# Patient Record
Sex: Female | Born: 1960 | Race: White | Hispanic: No | Smoking: Current every day smoker
Health system: Southern US, Community
[De-identification: ages and names within clinical notes are randomized; demographics above are authoritative.]

## PROBLEM LIST (undated history)

## (undated) DIAGNOSIS — F32A Depression, unspecified: Secondary | ICD-10-CM

## (undated) DIAGNOSIS — I10 Essential (primary) hypertension: Secondary | ICD-10-CM

## (undated) DIAGNOSIS — M1990 Unspecified osteoarthritis, unspecified site: Secondary | ICD-10-CM

## (undated) DIAGNOSIS — F329 Major depressive disorder, single episode, unspecified: Secondary | ICD-10-CM

## (undated) DIAGNOSIS — M25562 Pain in left knee: Secondary | ICD-10-CM

## (undated) DIAGNOSIS — Z0000 Encounter for general adult medical examination without abnormal findings: Secondary | ICD-10-CM

## (undated) HISTORY — PX: KNEE SURGERY: SHX244

## (undated) HISTORY — DX: Encounter for general adult medical examination without abnormal findings: Z00.00

## (undated) HISTORY — DX: Pain in left knee: M25.562

---

## 2011-04-03 ENCOUNTER — Ambulatory Visit (INDEPENDENT_AMBULATORY_CARE_PROVIDER_SITE_OTHER): Payer: 59 | Admitting: Internal Medicine

## 2011-04-03 ENCOUNTER — Encounter: Payer: Self-pay | Admitting: Internal Medicine

## 2011-04-03 ENCOUNTER — Other Ambulatory Visit: Payer: Self-pay | Admitting: Internal Medicine

## 2011-04-03 DIAGNOSIS — F319 Bipolar disorder, unspecified: Secondary | ICD-10-CM

## 2011-04-03 DIAGNOSIS — M179 Osteoarthritis of knee, unspecified: Secondary | ICD-10-CM

## 2011-04-03 DIAGNOSIS — Z1239 Encounter for other screening for malignant neoplasm of breast: Secondary | ICD-10-CM

## 2011-04-03 DIAGNOSIS — Z1231 Encounter for screening mammogram for malignant neoplasm of breast: Secondary | ICD-10-CM

## 2011-04-03 DIAGNOSIS — F17200 Nicotine dependence, unspecified, uncomplicated: Secondary | ICD-10-CM

## 2011-04-03 DIAGNOSIS — R7309 Other abnormal glucose: Secondary | ICD-10-CM

## 2011-04-03 DIAGNOSIS — I10 Essential (primary) hypertension: Secondary | ICD-10-CM

## 2011-04-03 DIAGNOSIS — R7302 Impaired glucose tolerance (oral): Secondary | ICD-10-CM

## 2011-04-03 DIAGNOSIS — M1710 Unilateral primary osteoarthritis, unspecified knee: Secondary | ICD-10-CM | POA: Insufficient documentation

## 2011-04-03 DIAGNOSIS — E785 Hyperlipidemia, unspecified: Secondary | ICD-10-CM

## 2011-04-03 NOTE — Assessment & Plan Note (Signed)
Asx. Pt questions diagnosis. Asked to followup closely if sx's of depression and/or mania.

## 2011-04-03 NOTE — Assessment & Plan Note (Signed)
Schedule screening mammogram

## 2011-04-03 NOTE — Assessment & Plan Note (Signed)
Stable and mild. Low sugar/carb diet, exercise as tolerated and wt loss.

## 2011-04-03 NOTE — Assessment & Plan Note (Signed)
Counseled regarding the need for cessation. Pt states understanding but is in contemplative phase.

## 2011-04-03 NOTE — Assessment & Plan Note (Signed)
Stable. Continue pravachol. Obtain flp/lft with next visit.

## 2011-04-03 NOTE — Assessment & Plan Note (Signed)
Normotensive. Continue current regimen. Monitor bp as outpt and followup in clinic as scheduled.

## 2011-04-03 NOTE — Progress Notes (Signed)
  Subjective:    Patient ID: Yvonne Rice, female    DOB: April 14, 1961, 50 y.o.   MRN: [id number]  HPI patient presented to clinic to establish primary care and for followup of hyperglycemia. His history of glucose intolerance with blood sugars fasting slightly above one hundred. Has no formal diagnosis of diabetes mellitus and denies polydipsia or polyuria. Apparently has attempted Glucophage and Actos in the past was intolerant to both medications. Previously diagnosed with bipolar disorder maintained on Trileptal as well as Lamictal previously. Patient states has been some disagreement about the diagnosis between psychiatrists and currently feels stable without symptoms of depression or mania. Is off medications and is without psychiatry. Smokes tobacco chronically less than one pack per day and is in the contemplative state. History of hypertension which is currently well controlled with current regimen without adverse effect. Tolerate statin therapy without myalgias or abnormal LFTs. Suffers from chronic left knee osteoarthritis status post steroid injections, Synvisc injections and was recommended for surgery at a later point in her life. Walks with assistance of a cane and  pain is manageable with Clinoril and Tylenol when necessary. In the past has had handicap placard. Is status post hysterectomy for noncancerous indication. Needs updated screening mammogram with last mammogram being a diagnostic mammogram 12/11 and unremarkable and last screening mammogram 4/11. Reviewed last lab work dated March 2012 including Chem-7 glucose of one hundred and two, normal liver function test and fasting lipid profile with LDL of seventy-four. Tetanus up-to-date 2005. No other complaints.  Reviewed past medical history: Past history, medications, allergies, social history and family history    Review of Systems  Constitutional: Negative for fever and chills.  Respiratory: Negative for cough and shortness of  breath.   Gastrointestinal: Negative for abdominal pain and blood in stool.  Musculoskeletal: Positive for arthralgias. Negative for myalgias and joint swelling.  Skin: Negative for color change and rash.  Psychiatric/Behavioral: Negative for dysphoric mood and agitation. The patient is not nervous/anxious.   All other systems reviewed and are negative.       Objective:   Physical Exam  Nursing note and vitals reviewed. Constitutional: She appears well-developed and well-nourished. No distress.  HENT:  Head: Normocephalic and atraumatic.  Right Ear: External ear normal.  Left Ear: External ear normal.  Nose: Nose normal.  Mouth/Throat: Oropharynx is clear and moist. No oropharyngeal exudate.  Eyes: Conjunctivae are normal. No scleral icterus.  Neck: Neck supple. No JVD present.  Cardiovascular: Normal rate, regular rhythm and normal heart sounds.  Exam reveals no gallop and no friction rub.   No murmur heard. Pulmonary/Chest: Effort normal and breath sounds normal. No respiratory distress. She has no wheezes. She has no rales.  Musculoskeletal:       Left knee: no erythem, warmth or effusion. +crepitus.  Lymphadenopathy:    She has no cervical adenopathy.  Neurological: She is alert.  Skin: Skin is warm and dry. She is not diaphoretic.  Psychiatric: She has a normal mood and affect. Her behavior is normal.          Assessment & Plan:

## 2011-04-03 NOTE — Assessment & Plan Note (Signed)
Stable. Continue current regimen. Handicap placard form provided.

## 2011-04-15 ENCOUNTER — Ambulatory Visit
Admission: RE | Admit: 2011-04-15 | Discharge: 2011-04-15 | Disposition: A | Discharge status: Home / Self Care | Payer: 59 | Source: Ambulatory Visit | Attending: Internal Medicine | Admitting: Internal Medicine

## 2011-04-15 DIAGNOSIS — Z1231 Encounter for screening mammogram for malignant neoplasm of breast: Secondary | ICD-10-CM

## 2011-07-04 ENCOUNTER — Ambulatory Visit: Payer: 59 | Admitting: Internal Medicine

## 2011-07-04 ENCOUNTER — Encounter: Payer: Self-pay | Admitting: Internal Medicine

## 2011-07-04 ENCOUNTER — Ambulatory Visit (INDEPENDENT_AMBULATORY_CARE_PROVIDER_SITE_OTHER): Payer: 59 | Admitting: Internal Medicine

## 2011-07-04 DIAGNOSIS — I10 Essential (primary) hypertension: Secondary | ICD-10-CM

## 2011-07-04 DIAGNOSIS — Z79899 Other long term (current) drug therapy: Secondary | ICD-10-CM

## 2011-07-04 DIAGNOSIS — E785 Hyperlipidemia, unspecified: Secondary | ICD-10-CM

## 2011-07-04 LAB — CBC WITH DIFFERENTIAL/PLATELET
Basophils Absolute: 0 10*3/uL (ref 0.0–0.1)
Basophils Relative: 0 % (ref 0–1)
Eosinophils Absolute: 0.1 10*3/uL (ref 0.0–0.7)
Eosinophils Relative: 1 % (ref 0–5)
HCT: 45.4 % (ref 36.0–46.0)
Hemoglobin: 15.4 g/dL — ABNORMAL HIGH (ref 12.0–15.0)
Lymphocytes Relative: 24 % (ref 12–46)
Lymphs Abs: 1.8 10*3/uL (ref 0.7–4.0)
MCH: 30.9 pg (ref 26.0–34.0)
MCHC: 33.9 g/dL (ref 30.0–36.0)
MCV: 91.2 fL (ref 78.0–100.0)
Monocytes Absolute: 0.5 10*3/uL (ref 0.1–1.0)
Monocytes Relative: 7 % (ref 3–12)
Neutro Abs: 5.1 10*3/uL (ref 1.7–7.7)
Neutrophils Relative %: 68 % (ref 43–77)
Platelets: 270 10*3/uL (ref 150–400)
RBC: 4.98 MIL/uL (ref 3.87–5.11)
RDW: 13.3 % (ref 11.5–15.5)
WBC: 7.4 10*3/uL (ref 4.0–10.5)

## 2011-07-04 LAB — LIPID PANEL
Cholesterol: 170 mg/dL (ref 0–200)
HDL: 51 mg/dL
LDL Cholesterol: 91 mg/dL (ref 0–99)
Total CHOL/HDL Ratio: 3.3 ratio
Triglycerides: 140 mg/dL
VLDL: 28 mg/dL (ref 0–40)

## 2011-07-04 LAB — BASIC METABOLIC PANEL
BUN: 8 mg/dL (ref 6–23)
CO2: 27 mEq/L (ref 19–32)
Calcium: 10.8 mg/dL — ABNORMAL HIGH (ref 8.4–10.5)
Glucose, Bld: 107 mg/dL — ABNORMAL HIGH (ref 70–99)
Potassium: 4.4 mEq/L (ref 3.5–5.3)
Sodium: 140 mEq/L (ref 135–145)

## 2011-07-04 LAB — HEPATIC FUNCTION PANEL
ALT: 17 U/L (ref 0–35)
Bilirubin, Direct: 0.1 mg/dL (ref 0.0–0.3)
Indirect Bilirubin: 0.3 mg/dL (ref 0.0–0.9)
Total Bilirubin: 0.4 mg/dL (ref 0.3–1.2)

## 2011-07-04 MED ORDER — PRAVASTATIN SODIUM 40 MG PO TABS: 40.0000 mg | ORAL_TABLET | Freq: Every day | ORAL | Status: DC

## 2011-07-04 MED ORDER — DILTIAZEM HCL ER COATED BEADS 180 MG PO CP24: 360.0000 mg | ORAL_CAPSULE | Freq: Every day | ORAL | Status: DC

## 2011-07-04 MED ORDER — LISINOPRIL-HYDROCHLOROTHIAZIDE 10-12.5 MG PO TABS: 1.0000 | ORAL_TABLET | Freq: Every day | ORAL | Status: DC

## 2011-07-07 NOTE — Progress Notes (Signed)
  Subjective:    Patient ID: Yvonne Rice, female    DOB: [birth year], 50 y.o.   MRN: [id number]  HPI Pt presents to clinic for followup of multiple medical problems.  H/o HTN and bp reviewed nl. Tolerates statin tx without myalgias or abn lft. H/o hyperglycemia with fsbs fasting usually 90's without medication. Using tobacco but enrolled in cessation program at work.   No past medical history on file. No past surgical history on file.  reports that she has been smoking Cigarettes.  She has been smoking about .8 packs per day. She has never used smokeless tobacco. She reports that she does not drink alcohol or use illicit drugs. family history includes Cancer in her mother and paternal grandmother and Heart disease in her father. Allergies  Allergen Reactions  . Sulfa Drugs Cross Reactors Hives     Review of Systems see hpi     Objective:   Physical Exam  Physical Exam  Nursing note and vitals reviewed. Constitutional: Appears well-developed and well-nourished. No distress.  HENT:  Head: Normocephalic and atraumatic.  Right Ear: External ear normal.  Left Ear: External ear normal.  Eyes: Conjunctivae are normal. No scleral icterus.  Neck: Neck supple. Carotid bruit is not present.  Cardiovascular: Normal rate, regular rhythm and normal heart sounds.  Exam reveals no gallop and no friction rub.   No murmur heard. Pulmonary/Chest: Effort normal and breath sounds normal. No respiratory distress. He has no wheezes. no rales.  Lymphadenopathy:    He has no cervical adenopathy.  Neurological:Alert.  Skin: Skin is warm and dry. Not diaphoretic.  Psychiatric: Has a normal mood and affect.        Assessment & Plan:

## 2011-07-07 NOTE — Assessment & Plan Note (Signed)
Obtain lipid/lft. 

## 2011-07-11 ENCOUNTER — Other Ambulatory Visit: Payer: Self-pay | Admitting: *Deleted

## 2011-07-11 DIAGNOSIS — R799 Abnormal finding of blood chemistry, unspecified: Secondary | ICD-10-CM

## 2011-07-27 LAB — CALCIUM, IONIZED: Calcium, Ion: 1.36 mmol/L — ABNORMAL HIGH (ref 1.12–1.32)

## 2011-08-08 ENCOUNTER — Other Ambulatory Visit: Payer: Self-pay | Admitting: Internal Medicine

## 2011-08-08 MED ORDER — LISINOPRIL 10 MG PO TABS: 10.0000 mg | ORAL_TABLET | Freq: Every day | ORAL | Status: DC

## 2011-09-21 LAB — PHOSPHORUS: Phosphorus: 4 mg/dL (ref 2.3–4.6)

## 2011-10-24 ENCOUNTER — Other Ambulatory Visit: Payer: Self-pay | Admitting: *Deleted

## 2011-10-24 MED ORDER — TRAMADOL HCL 50 MG PO TABS: 50.0000 mg | ORAL_TABLET | Freq: Four times a day (QID) | ORAL | Status: DC | PRN

## 2011-10-24 NOTE — Telephone Encounter (Signed)
Rx refill sent to pharmacy. 

## 2011-10-24 NOTE — Telephone Encounter (Signed)
Don't believe we've filled it before. #[phone] [phone]

## 2011-10-24 NOTE — Telephone Encounter (Signed)
Received message from pt requesting refill of Tramadol 50mg  1 tablet four times daily as needed. States current refills have expired. Please advise number of refills to give pt.

## 2012-01-03 ENCOUNTER — Ambulatory Visit (INDEPENDENT_AMBULATORY_CARE_PROVIDER_SITE_OTHER): Payer: 59 | Admitting: Internal Medicine

## 2012-01-03 ENCOUNTER — Encounter: Payer: Self-pay | Admitting: Internal Medicine

## 2012-01-03 DIAGNOSIS — F329 Major depressive disorder, single episode, unspecified: Secondary | ICD-10-CM

## 2012-01-03 DIAGNOSIS — I10 Essential (primary) hypertension: Secondary | ICD-10-CM

## 2012-01-03 DIAGNOSIS — E785 Hyperlipidemia, unspecified: Secondary | ICD-10-CM

## 2012-01-03 DIAGNOSIS — Z79899 Other long term (current) drug therapy: Secondary | ICD-10-CM

## 2012-01-03 LAB — HEPATIC FUNCTION PANEL
ALT: 22 U/L (ref 0–35)
Bilirubin, Direct: 0.1 mg/dL (ref 0.0–0.3)
Total Protein: 6.8 g/dL (ref 6.0–8.3)

## 2012-01-03 LAB — CBC WITH DIFFERENTIAL/PLATELET
Basophils Absolute: 0 10*3/uL (ref 0.0–0.1)
Eosinophils Relative: 1 % (ref 0–5)
HCT: 42.7 % (ref 36.0–46.0)
Hemoglobin: 14.2 g/dL (ref 12.0–15.0)
Lymphocytes Relative: 24 % (ref 12–46)
MCH: 30.5 pg (ref 26.0–34.0)
MCHC: 33.3 g/dL (ref 30.0–36.0)
Neutrophils Relative %: 66 % (ref 43–77)
RBC: 4.66 MIL/uL (ref 3.87–5.11)

## 2012-01-03 LAB — LIPID PANEL
Cholesterol: 167 mg/dL (ref 0–200)
Total CHOL/HDL Ratio: 3.2 Ratio
Triglycerides: 95 mg/dL (ref <150)
VLDL: 19 mg/dL (ref 0–40)

## 2012-01-03 LAB — BASIC METABOLIC PANEL
Chloride: 106 mEq/L (ref 96–112)
Creat: 0.76 mg/dL (ref 0.50–1.10)

## 2012-01-03 MED ORDER — DILTIAZEM HCL ER COATED BEADS 180 MG PO CP24: 360.0000 mg | ORAL_CAPSULE | Freq: Every day | ORAL | Status: DC

## 2012-01-03 MED ORDER — CITALOPRAM HYDROBROMIDE 20 MG PO TABS: 20.0000 mg | ORAL_TABLET | Freq: Every day | ORAL | Status: DC

## 2012-01-03 MED ORDER — SULINDAC 200 MG PO TABS: 200.0000 mg | ORAL_TABLET | Freq: Two times a day (BID) | ORAL | Status: DC

## 2012-01-03 MED ORDER — TRAMADOL HCL 50 MG PO TABS: 50.0000 mg | ORAL_TABLET | Freq: Four times a day (QID) | ORAL | Status: DC | PRN

## 2012-01-03 MED ORDER — PRAVASTATIN SODIUM 40 MG PO TABS: 40.0000 mg | ORAL_TABLET | Freq: Every day | ORAL | Status: DC

## 2012-01-03 MED ORDER — LISINOPRIL 10 MG PO TABS: 10.0000 mg | ORAL_TABLET | Freq: Every day | ORAL | Status: DC

## 2012-01-03 NOTE — Assessment & Plan Note (Signed)
Begin celexa 20mg  qd. Cautioned regarding use of celexa and ultram. States uses ultram rarely. F/u one month or sooner if needed.

## 2012-01-03 NOTE — Assessment & Plan Note (Signed)
Obtain lipid/lft. 

## 2012-01-03 NOTE — Progress Notes (Signed)
  Subjective:    Patient ID: Yvonne Rice, female    DOB: 1961/04/23, 51 y.o.   MRN: [id number]  HPI Pt presents to clinic for followup of multiple medical problems. Notes depressive sx's recently with h/o depression. Not currently tx'ed but feels the need for medication. Not currently interested in therapist. Has crying spells and work stressors but denies SI/HI. Chronic knee pain followed by orthopedics with recommendation for surgery. Restricts her exercise. BP reviewed normotensive. No other complaints.  No past medical history on file. No past surgical history on file.  reports that she has been smoking Cigarettes.  She has been smoking about .8 packs per day. She has never used smokeless tobacco. She reports that she does not drink alcohol or use illicit drugs. family history includes Cancer in her mother and paternal grandmother and Heart disease in her father. Allergies  Allergen Reactions  . Sulfa Drugs Cross Reactors Hives      Review of Systems see hpi     Objective:   Physical Exam  Physical Exam  Nursing note and vitals reviewed. Constitutional: Appears well-developed and well-nourished. No distress.  HENT:  Head: Normocephalic and atraumatic.  Right Ear: External ear normal.  Left Ear: External ear normal.  Eyes: Conjunctivae are normal. No scleral icterus.  Neck: Neck supple. Carotid bruit is not present.  Cardiovascular: Normal rate, regular rhythm and normal heart sounds.  Exam reveals no gallop and no friction rub.   No murmur heard. Pulmonary/Chest: Effort normal and breath sounds normal. No respiratory distress. He has no wheezes. no rales.  Lymphadenopathy:    He has no cervical adenopathy.  Neurological:Alert.  Skin: Skin is warm and dry. Not diaphoretic.  Psychiatric: Has a normal mood and affect.        Assessment & Plan:

## 2012-01-03 NOTE — Assessment & Plan Note (Signed)
Normotensive and stable. Continue current regimen. Monitor bp as outpt and followup in clinic as scheduled. Obtain cbc, chem7 

## 2012-01-07 ENCOUNTER — Other Ambulatory Visit: Payer: Self-pay | Admitting: Internal Medicine

## 2012-01-22 ENCOUNTER — Other Ambulatory Visit: Payer: Self-pay | Admitting: *Deleted

## 2012-01-23 LAB — CALCIUM, IONIZED: Calcium, Ion: 1.31 mmol/L (ref 1.12–1.32)

## 2012-01-23 LAB — VITAMIN D 25 HYDROXY (VIT D DEFICIENCY, FRACTURES): Vit D, 25-Hydroxy: 43 ng/mL (ref 30–89)

## 2012-01-28 ENCOUNTER — Other Ambulatory Visit: Payer: Self-pay | Admitting: Internal Medicine

## 2012-01-31 ENCOUNTER — Encounter: Payer: Self-pay | Admitting: Internal Medicine

## 2012-01-31 ENCOUNTER — Ambulatory Visit (INDEPENDENT_AMBULATORY_CARE_PROVIDER_SITE_OTHER): Payer: 59 | Admitting: Internal Medicine

## 2012-01-31 VITALS — BP 108/68 | HR 103 | Temp 97.8°F | Resp 20 | Wt 190.0 lb

## 2012-01-31 DIAGNOSIS — F3289 Other specified depressive episodes: Secondary | ICD-10-CM

## 2012-01-31 DIAGNOSIS — F329 Major depressive disorder, single episode, unspecified: Secondary | ICD-10-CM

## 2012-01-31 MED ORDER — CITALOPRAM HYDROBROMIDE 20 MG PO TABS: 20.0000 mg | ORAL_TABLET | Freq: Every day | ORAL | Status: DC

## 2012-01-31 NOTE — Progress Notes (Signed)
This encounter was created in error - please disregard.

## 2012-01-31 NOTE — Patient Instructions (Signed)
Please schedule ionized calcium, phosphorus, PTH- hypercalcemia, tsh-401.9, lipid/lft-272.4 and chem7-v58.69 prior to next visit

## 2012-02-02 NOTE — Progress Notes (Signed)
  Subjective:    Patient ID: Yvonne Rice, female    DOB: 03-Dec-1960, 51 y.o.   MRN: [id number]  HPI Pt presents to clinic for followup of multiple medical problems. Tolerating celexa without adverse effect. Notes improvement of mood. Reviewed elevated calcium followed by nl ionized calcium. No complaints.  No past medical history on file. No past surgical history on file.  reports that she has been smoking Cigarettes.  She has been smoking about .8 packs per day. She has never used smokeless tobacco. She reports that she does not drink alcohol or use illicit drugs. family history includes Cancer in her mother and paternal grandmother and Heart disease in her father. Allergies  Allergen Reactions  . Sulfa Drugs Cross Reactors Hives      Review of Systems see hpi     Objective:   Physical Exam  Nursing note and vitals reviewed. Constitutional: She appears well-developed and well-nourished. No distress.  HENT:  Head: Normocephalic and atraumatic.  Right Ear: External ear normal.  Left Ear: External ear normal.  Eyes: Conjunctivae are normal. No scleral icterus.  Cardiovascular: Normal rate, regular rhythm and normal heart sounds.   Pulmonary/Chest: Effort normal and breath sounds normal. No respiratory distress. She has no wheezes. She has no rales.  Neurological: She is alert.  Skin: She is not diaphoretic.  Psychiatric: She has a normal mood and affect. Her behavior is normal.          Assessment & Plan:

## 2012-02-02 NOTE — Assessment & Plan Note (Signed)
Normalized ionized calcium. Repeat calcium labs with next visit

## 2012-02-02 NOTE — Assessment & Plan Note (Signed)
Improved with celexa. Continue current dose.

## 2012-02-21 ENCOUNTER — Telehealth: Payer: Self-pay | Admitting: *Deleted

## 2012-02-21 NOTE — Telephone Encounter (Signed)
Fax received from [facility] regarding clarification of patients medication. The fax stated patient is trying to refill Lisinopril & Lisinopril HCT 10/12.5. Should the patient be on both medications?  (order # [id number])

## 2012-02-21 NOTE — Telephone Encounter (Signed)
No. Lisinopril hct changed to lisinopril 07/2011

## 2012-02-21 NOTE — Telephone Encounter (Signed)
Call placed to [facility] [phone], spoke with Onalee Hua a pharmacist; he was provide clarification on medication. No further action required.

## 2012-03-24 ENCOUNTER — Other Ambulatory Visit: Payer: Self-pay | Admitting: Orthopedic Surgery

## 2012-03-27 ENCOUNTER — Encounter (HOSPITAL_BASED_OUTPATIENT_CLINIC_OR_DEPARTMENT_OTHER): Payer: Self-pay | Admitting: Family Medicine

## 2012-03-27 ENCOUNTER — Emergency Department (HOSPITAL_BASED_OUTPATIENT_CLINIC_OR_DEPARTMENT_OTHER)
Admission: EM | Admit: 2012-03-27 | Discharge: 2012-03-27 | Disposition: A | Discharge status: Home / Self Care | Payer: 59 | Attending: Emergency Medicine | Admitting: Emergency Medicine

## 2012-03-27 ENCOUNTER — Emergency Department (HOSPITAL_BASED_OUTPATIENT_CLINIC_OR_DEPARTMENT_OTHER): Payer: 59

## 2012-03-27 DIAGNOSIS — F3289 Other specified depressive episodes: Secondary | ICD-10-CM | POA: Insufficient documentation

## 2012-03-27 DIAGNOSIS — I10 Essential (primary) hypertension: Secondary | ICD-10-CM | POA: Insufficient documentation

## 2012-03-27 DIAGNOSIS — F329 Major depressive disorder, single episode, unspecified: Secondary | ICD-10-CM | POA: Insufficient documentation

## 2012-03-27 DIAGNOSIS — Z8739 Personal history of other diseases of the musculoskeletal system and connective tissue: Secondary | ICD-10-CM | POA: Insufficient documentation

## 2012-03-27 DIAGNOSIS — Z79899 Other long term (current) drug therapy: Secondary | ICD-10-CM | POA: Insufficient documentation

## 2012-03-27 DIAGNOSIS — M25569 Pain in unspecified knee: Secondary | ICD-10-CM

## 2012-03-27 HISTORY — DX: Major depressive disorder, single episode, unspecified: F32.9

## 2012-03-27 HISTORY — DX: Unspecified osteoarthritis, unspecified site: M19.90

## 2012-03-27 HISTORY — DX: Depression, unspecified: F32.A

## 2012-03-27 HISTORY — DX: Essential (primary) hypertension: I10

## 2012-03-27 MED ORDER — HYDROCODONE-ACETAMINOPHEN 5-325 MG PO TABS: 1.0000 | ORAL_TABLET | Freq: Four times a day (QID) | ORAL | Status: AC | PRN

## 2012-03-27 NOTE — ED Notes (Signed)
Patient transported to X-ray 

## 2012-03-27 NOTE — Discharge Instructions (Signed)
Knee Pain The knee is the complex joint between your thigh and your lower leg. It is made up of bones, tendons, ligaments, and cartilage. The bones that make up the knee are:  The femur in the thigh.   The tibia and fibula in the lower leg.   The patella or kneecap riding in the groove on the lower femur.  CAUSES  Knee pain is a common complaint with many causes. A few of these causes are:  Injury, such as:   A ruptured ligament or tendon injury.   Torn cartilage.   Medical conditions, such as:   Gout   Arthritis   Infections   Overuse, over training or overdoing a physical activity.  Knee pain can be minor or severe. Knee pain can accompany debilitating injury. Minor knee problems often respond well to self-care measures or get well on their own. More serious injuries may need medical intervention or even surgery. SYMPTOMS The knee is complex. Symptoms of knee problems can vary widely. Some of the problems are:  Pain with movement and weight bearing.   Swelling and tenderness.   Buckling of the knee.   Inability to straighten or extend your knee.   Your knee locks and you cannot straighten it.   Warmth and redness with pain and fever.   Deformity or dislocation of the kneecap.  DIAGNOSIS  Determining what is wrong may be very straight forward such as when there is an injury. It can also be challenging because of the complexity of the knee. Tests to make a diagnosis may include:  Your caregiver taking a history and doing a physical exam.   Routine X-rays can be used to rule out other problems. X-rays will not reveal a cartilage tear. Some injuries of the knee can be diagnosed by:   Arthroscopy a surgical technique by which a small video camera is inserted through tiny incisions on the sides of the knee. This procedure is used to examine and repair internal knee joint problems. Tiny instruments can be used during arthroscopy to repair the torn knee cartilage  (meniscus).   Arthrography is a radiology technique. A contrast liquid is directly injected into the knee joint. Internal structures of the knee joint then become visible on X-ray film.   An MRI scan is a non x-ray radiology procedure in which magnetic fields and a computer produce two- or three-dimensional images of the inside of the knee. Cartilage tears are often visible using an MRI scanner. MRI scans have largely replaced arthrography in diagnosing cartilage tears of the knee.   Blood work.   Examination of the fluid that helps to lubricate the knee joint (synovial fluid). This is done by taking a sample out using a needle and a syringe.  TREATMENT The treatment of knee problems depends on the cause. Some of these treatments are:  Depending on the injury, proper casting, splinting, surgery or physical therapy care will be needed.   Give yourself adequate recovery time. Do not overuse your joints. If you begin to get sore during workout routines, back off. Slow down or do fewer repetitions.   For repetitive activities such as cycling or running, maintain your strength and nutrition.   Alternate muscle groups. For example if you are a weight lifter, work the upper body on one day and the lower body the next.   Either tight or weak muscles do not give the proper support for your knee. Tight or weak muscles do not absorb the stress placed   on the knee joint. Keep the muscles surrounding the knee strong.   Take care of mechanical problems.   If you have flat feet, orthotics or special shoes may help. See your caregiver if you need help.   Arch supports, sometimes with wedges on the inner or outer aspect of the heel, can help. These can shift pressure away from the side of the knee most bothered by osteoarthritis.   A brace called an "unloader" brace also may be used to help ease the pressure on the most arthritic side of the knee.   If your caregiver has prescribed crutches, braces,  wraps or ice, use as directed. The acronym for this is PRICE. This means protection, rest, ice, compression and elevation.   Nonsteroidal anti-inflammatory drugs (NSAID's), can help relieve pain. But if taken immediately after an injury, they may actually increase swelling. Take NSAID's with food in your stomach. Stop them if you develop stomach problems. Do not take these if you have a history of ulcers, stomach pain or bleeding from the bowel. Do not take without your caregiver's approval if you have problems with fluid retention, heart failure, or kidney problems.   For ongoing knee problems, physical therapy may be helpful.   Glucosamine and chondroitin are over-the-counter dietary supplements. Both may help relieve the pain of osteoarthritis in the knee. These medicines are different from the usual anti-inflammatory drugs. Glucosamine may decrease the rate of cartilage destruction.   Injections of a corticosteroid drug into your knee joint may help reduce the symptoms of an arthritis flare-up. They may provide pain relief that lasts a few months. You may have to wait a few months between injections. The injections do have a small increased risk of infection, water retention and elevated blood sugar levels.   Hyaluronic acid injected into damaged joints may ease pain and provide lubrication. These injections may work by reducing inflammation. A series of shots may give relief for as long as 6 months.   Topical painkillers. Applying certain ointments to your skin may help relieve the pain and stiffness of osteoarthritis. Ask your pharmacist for suggestions. Many over the-counter products are approved for temporary relief of arthritis pain.   In some countries, doctors often prescribe topical NSAID's for relief of chronic conditions such as arthritis and tendinitis. A review of treatment with NSAID creams found that they worked as well as oral medications but without the serious side effects.    PREVENTION  Maintain a healthy weight. Extra pounds put more strain on your joints.   Get strong, stay limber. Weak muscles are a common cause of knee injuries. Stretching is important. Include flexibility exercises in your workouts.   Be smart about exercise. If you have osteoarthritis, chronic knee pain or recurring injuries, you may need to change the way you exercise. This does not mean you have to stop being active. If your knees ache after jogging or playing basketball, consider switching to swimming, water aerobics or other low-impact activities, at least for a few days a week. Sometimes limiting high-impact activities will provide relief.   Make sure your shoes fit well. Choose footwear that is right for your sport.   Protect your knees. Use the proper gear for knee-sensitive activities. Use kneepads when playing volleyball or laying carpet. Buckle your seat belt every time you drive. Most shattered kneecaps occur in car accidents.   Rest when you are tired.  SEEK MEDICAL CARE IF:  You have knee pain that is continual and does not   seem to be getting better.  SEEK IMMEDIATE MEDICAL CARE IF:  Your knee joint feels hot to the touch and you have a high fever. MAKE SURE YOU:   Understand these instructions.   Will watch your condition.   Will get help right away if you are not doing well or get worse.  Document Released: 08/25/2007 Document Revised: 10/17/2011 Document Reviewed: 08/25/2007 [facility] Patient Information 2012 [place], [place].  Watch for the development of redness, swelling and is not resolved with elevation, calf pain as this may indicate a blood clot

## 2012-03-27 NOTE — ED Notes (Signed)
Pt c/o left leg pain and tingling from left knee to foot x 2 months. Calf is non tender. Cms intact. Pt ambulatory.

## 2012-03-27 NOTE — ED Notes (Signed)
MD at bedside. 

## 2012-03-27 NOTE — ED Provider Notes (Signed)
History     CSN: [id number]  Arrival date & time 03/27/12  0865   First MD Initiated Contact with Patient 03/27/12 [phone]      Chief Complaint  Patient presents with  . Leg Pain    (Consider location/radiation/quality/duration/timing/severity/associated sxs/prior treatment) HPI Comments: Increasing pain in the subjective feeling of instability over the past 2 months. States she nearly fell getting out of the bathtub today. Has no weakness, numbness, tingling. Her mother had DVTs so she seeks evaluation for this.  Patient is a 51 y.o. female presenting with knee pain. The history is provided by the patient. No language interpreter was used.  Knee Pain This is a chronic problem. Episode onset: 2 months ago. The problem occurs constantly. The problem has been gradually worsening. Pertinent negatives include no chest pain, no abdominal pain, no headaches and no shortness of breath. The symptoms are aggravated by standing and walking. The symptoms are relieved by nothing. Treatments tried: sulindac. The treatment provided mild relief.    Past Medical History  Diagnosis Date  . Hypertension   . Depression   . Arthritis     Past Surgical History  Procedure Date  . Knee surgery     Family History  Problem Relation Age of Onset  . Cancer Mother     colon  . Heart disease Father   . Cancer Paternal Grandmother     ovarian, uterine, breast    History  Substance Use Topics  . Smoking status: Current Everyday Smoker -- 0.8 packs/day    Types: Cigarettes  . Smokeless tobacco: Never Used  . Alcohol Use: No    OB History    Grav Para Term Preterm Abortions TAB SAB Ect Mult Living                  Review of Systems  Constitutional: Negative for fever, chills, activity change and fatigue.  HENT: Negative for congestion, sore throat, rhinorrhea, neck pain and neck stiffness.   Respiratory: Negative for cough and shortness of breath.   Cardiovascular: Negative for chest pain  and palpitations.  Gastrointestinal: Negative for nausea, vomiting and abdominal pain.  Genitourinary: Negative for dysuria, urgency, frequency and flank pain.  Musculoskeletal: Positive for arthralgias. Negative for myalgias, back pain and gait problem.  Neurological: Negative for dizziness, weakness, light-headedness, numbness and headaches.  All other systems reviewed and are negative.    Allergies  Sulfa drugs cross reactors  Home Medications   Current Outpatient Rx  Name Route Sig Dispense Refill  . CITALOPRAM HYDROBROMIDE 20 MG PO TABS Oral Take 1 tablet (20 mg total) by mouth daily. 90 tablet 3  . DILTIAZEM HCL ER COATED BEADS 180 MG PO CP24 Oral Take 2 capsules (360 mg total) by mouth daily. 180 capsule 2  . HYDROCODONE-ACETAMINOPHEN 5-325 MG PO TABS Oral Take 1-2 tablets by mouth every 6 (six) hours as needed for pain. 15 tablet 0  . LISINOPRIL 10 MG PO TABS Oral Take 1 tablet (10 mg total) by mouth daily. 90 tablet 2    Rx change from Lisinopril-Hct  . PRAVASTATIN SODIUM 40 MG PO TABS Oral Take 1 tablet (40 mg total) by mouth daily. 90 tablet 2  . SULINDAC 200 MG PO TABS Oral Take 1 tablet (200 mg total) by mouth 2 (two) times daily. 180 tablet 1  . TRAMADOL HCL 50 MG PO TABS Oral Take 1 tablet (50 mg total) by mouth every 6 (six) hours as needed. 30 tablet 3  BP 138/79  Pulse 79  Temp(Src) 97.8 F (36.6 C) (Oral)  Resp 20  SpO2 98%  Physical Exam  Nursing note and vitals reviewed. Constitutional: She is oriented to person, place, and time. She appears well-developed and well-nourished. No distress.  HENT:  Head: Normocephalic and atraumatic.  Mouth/Throat: Oropharynx is clear and moist. No oropharyngeal exudate.  Eyes: Conjunctivae and EOM are normal. Pupils are equal, round, and reactive to light.  Neck: Normal range of motion. Neck supple.  Cardiovascular: Normal rate, regular rhythm, normal heart sounds and intact distal pulses.  Exam reveals no gallop and no  friction rub.   No murmur heard. Pulmonary/Chest: Effort normal and breath sounds normal. No respiratory distress. She exhibits no tenderness.  Abdominal: Soft. Bowel sounds are normal. There is no tenderness. There is no rebound and no guarding.  Musculoskeletal: She exhibits tenderness. She exhibits no edema.       Left knee: She exhibits normal range of motion, no swelling, no effusion, no ecchymosis and no LCL laxity. tenderness found. Medial joint line and lateral joint line tenderness noted. No MCL, no LCL and no patellar tendon tenderness noted.       No pain on palpation of the L calf or thigh.  Legs appear symmetrical with no evidence of swelling to the LLE.  Negative homans sign.  No palpable cords.  No erythema.  Symmetric distal pulses in LE bilaterally  Neurological: She is alert and oriented to person, place, and time. She has normal strength. No cranial nerve deficit or sensory deficit.    ED Course  Procedures (including critical care time)  Labs Reviewed - No data to display Dg Knee Complete 4 Views Left  03/27/2012  *RADIOLOGY REPORT*  Clinical Data: 51 year old female with pain and swelling.  LEFT KNEE - COMPLETE 4+ VIEW  Comparison: None.  Findings: No definite joint effusion. Bone mineralization is within normal limits.  Patella intact.  Joint spaces preserved.  No fracture dislocation.  IMPRESSION: No acute osseous abnormality identified about the left knee.  Original Report Authenticated By: Ulla Potash III, M.D.     1. Knee pain       MDM  Knee pain with no radiographic abnormalities at this time. I have no concern about a DVT at this time as she lacks cardinal symptoms. She has no swelling. She'll be discharged home with instructions to continue taking her anti-inflammatory medication. Norco was added for pain. Instructed to followup with her orthopedist in one week. Provided red flag symptoms for DVT        Dayton Bailiff, MD 03/27/12 [phone]

## 2012-05-08 ENCOUNTER — Other Ambulatory Visit: Payer: Self-pay | Admitting: *Deleted

## 2012-05-08 ENCOUNTER — Encounter: Payer: Self-pay | Admitting: Internal Medicine

## 2012-05-08 ENCOUNTER — Ambulatory Visit (INDEPENDENT_AMBULATORY_CARE_PROVIDER_SITE_OTHER): Payer: 59 | Admitting: Internal Medicine

## 2012-05-08 VITALS — BP 100/70 | HR 70 | Temp 98.0°F | Resp 16 | Wt 200.0 lb

## 2012-05-08 DIAGNOSIS — K5289 Other specified noninfective gastroenteritis and colitis: Secondary | ICD-10-CM

## 2012-05-08 DIAGNOSIS — R197 Diarrhea, unspecified: Secondary | ICD-10-CM

## 2012-05-08 DIAGNOSIS — K529 Noninfective gastroenteritis and colitis, unspecified: Secondary | ICD-10-CM

## 2012-05-08 LAB — CBC WITH DIFFERENTIAL/PLATELET
Basophils Absolute: 0.1 10*3/uL (ref 0.0–0.1)
Eosinophils Relative: 2 % (ref 0–5)
HCT: 41.7 % (ref 36.0–46.0)
Lymphocytes Relative: 42 % (ref 12–46)
Lymphs Abs: 3.4 10*3/uL (ref 0.7–4.0)
MCV: 87.4 fL (ref 78.0–100.0)
Monocytes Absolute: 0.6 10*3/uL (ref 0.1–1.0)
Neutro Abs: 3.8 10*3/uL (ref 1.7–7.7)
Platelets: 274 10*3/uL (ref 150–400)
RBC: 4.77 MIL/uL (ref 3.87–5.11)
RDW: 12.7 % (ref 11.5–15.5)
WBC: 8 10*3/uL (ref 4.0–10.5)

## 2012-05-08 LAB — BASIC METABOLIC PANEL
CO2: 25 mEq/L (ref 19–32)
Chloride: 108 mEq/L (ref 96–112)
Creat: 0.78 mg/dL (ref 0.50–1.10)
Potassium: 4.4 mEq/L (ref 3.5–5.3)

## 2012-05-08 MED ORDER — ONDANSETRON HCL 8 MG PO TABS: 8.0000 mg | ORAL_TABLET | Freq: Three times a day (TID) | ORAL | Status: AC | PRN

## 2012-05-08 MED ORDER — DIPHENOXYLATE-ATROPINE 2.5-0.025 MG PO TABS: 1.0000 | ORAL_TABLET | Freq: Four times a day (QID) | ORAL | Status: AC | PRN

## 2012-05-09 DIAGNOSIS — K529 Noninfective gastroenteritis and colitis, unspecified: Secondary | ICD-10-CM | POA: Insufficient documentation

## 2012-05-09 LAB — PHOSPHORUS: Phosphorus: 3.9 mg/dL (ref 2.3–4.6)

## 2012-05-09 NOTE — Assessment & Plan Note (Signed)
Attempt zofran and lomotil prn. Obtain cbc and chem7. Work note provided Followup if no improvement or worsening.

## 2012-05-09 NOTE — Progress Notes (Signed)
  Subjective:    Patient ID: Rhea Pink, female    DOB: 1961-05-03, 51 y.o.   MRN: [id number]  HPI Pt presents to clinic for evaluation of n/v/d. Notes 2-3 d h/o n/v without hematemesis and loose watery diarrhea without blood. No f/c, abd pain, dizziness or syncope. Using immodium and increasing fluid intake with gatorade. S/p left knee surgery ~2wks ago and denies receiving abx. No other complaints.   Past Medical History  Diagnosis Date  . Hypertension   . Depression   . Arthritis    Past Surgical History  Procedure Date  . Knee surgery     reports that she has been smoking Cigarettes.  She has been smoking about .8 packs per day. She has never used smokeless tobacco. She reports that she does not drink alcohol or use illicit drugs. family history includes Cancer in her mother and paternal grandmother and Heart disease in her father. Allergies  Allergen Reactions  . Sulfa Drugs Cross Reactors Hives     Review of Systems see hpi     Objective:   Physical Exam  Nursing note reviewed. Constitutional: She appears well-developed and well-nourished. No distress.  HENT:  Head: Normocephalic and atraumatic.  Right Ear: External ear normal.  Left Ear: External ear normal.  Eyes: Conjunctivae are normal. No scleral icterus.  Abdominal: Soft. Bowel sounds are normal. She exhibits no distension. There is no tenderness. There is no rebound and no guarding.  Neurological: She is alert.  Skin: Skin is warm and dry. She is not diaphoretic.  Psychiatric: She has a normal mood and affect.          Assessment & Plan:

## 2012-05-09 NOTE — Assessment & Plan Note (Signed)
Obtain icalcium, phos and pth

## 2012-05-27 ENCOUNTER — Telehealth: Payer: Self-pay | Admitting: *Deleted

## 2012-05-27 ENCOUNTER — Other Ambulatory Visit: Payer: Self-pay | Admitting: Internal Medicine

## 2012-05-27 NOTE — Telephone Encounter (Signed)
Notes Recorded by Edwyna Perfect, MD on 05/19/2012 at 8:42 PM Calcium levels remain elevated. Recommend endocrinology consult. If willing will begin referral  Patient returned your phone call. Best # is [phone] and it is okay to leave a detailed message.     Patient informed; understood & agreed. Please begin Endo referral process/SLS

## 2012-05-27 NOTE — Telephone Encounter (Signed)
done

## 2012-06-18 ENCOUNTER — Ambulatory Visit: Payer: 59 | Admitting: Endocrinology

## 2012-07-02 ENCOUNTER — Other Ambulatory Visit (INDEPENDENT_AMBULATORY_CARE_PROVIDER_SITE_OTHER): Payer: 59

## 2012-07-02 ENCOUNTER — Encounter: Payer: Self-pay | Admitting: Endocrinology

## 2012-07-02 ENCOUNTER — Ambulatory Visit (INDEPENDENT_AMBULATORY_CARE_PROVIDER_SITE_OTHER): Payer: 59 | Admitting: Endocrinology

## 2012-07-02 LAB — PHOSPHORUS: Phosphorus: 3.9 mg/dL (ref 2.3–4.6)

## 2012-07-02 LAB — MAGNESIUM: Magnesium: 2.1 mg/dL (ref 1.5–2.5)

## 2012-07-02 NOTE — Progress Notes (Signed)
Subjective:    Patient ID: Yvonne Rice, female    DOB: March 05, 1961, 51 y.o.   MRN: [id number]  HPI Pt says she has had intermittent hypercalcemia x approx 1 year.  It was first noticed on a routine blood test.  Pt was on hctz, but is off now.  She does not take vit a or d supplements, or antacids.  No prolonged bedrest.  She has never been on lithium.   No h/o sarcoidosis, PUD, urolithiasis, or thyroid dz.  She had fx right wrist many years ago.  She has 6 weeks of moderate fatigue, in the context of exertion.  No assoc sxs.   Past Medical History  Diagnosis Date  . Hypertension   . Depression   . Arthritis     Past Surgical History  Procedure Date  . Knee surgery     History   Social History  . Marital Status: Divorced    Spouse Name: N/A    Number of Children: N/A  . Years of Education: N/A   Occupational History  . Not on file.   Social History Main Topics  . Smoking status: Current Everyday Smoker -- 0.8 packs/day    Types: Cigarettes  . Smokeless tobacco: Never Used  . Alcohol Use: No  . Drug Use: No  . Sexually Active: Not on file   Other Topics Concern  . Not on file   Social History Narrative  . No narrative on file    Current Outpatient Prescriptions on File Prior to Visit  Medication Sig Dispense Refill  . citalopram (CELEXA) 20 MG tablet Take 1 tablet (20 mg total) by mouth daily.  90 tablet  3  . diltiazem (CARDIZEM CD) 180 MG 24 hr capsule Take 2 capsules (360 mg total) by mouth daily.  180 capsule  2  . lisinopril (PRINIVIL,ZESTRIL) 10 MG tablet Take 1 tablet (10 mg total) by mouth daily.  90 tablet  2  . pravastatin (PRAVACHOL) 40 MG tablet Take 1 tablet (40 mg total) by mouth daily.  90 tablet  2  . sulindac (CLINORIL) 200 MG tablet Take 1 tablet (200 mg total) by mouth 2 (two) times daily.  180 tablet  1  . traMADol (ULTRAM) 50 MG tablet Take 1 tablet (50 mg total) by mouth every 6 (six) hours as needed.  30 tablet  3    Allergies  Allergen  Reactions  . Sulfa Drugs Cross Reactors Hives    Family History  Problem Relation Age of Onset  . Cancer Mother     colon  . Heart disease Father   . Cancer Paternal Grandmother     ovarian, uterine, breast  mother has osteoporosis, but no one has hypercalcemia  There were no vitals taken for this visit.  Review of Systems denies weight loss, galactorrhea, hematuria, memory loss, numbness, arthralgias, abdominal pain, muscle weakness, urinary frequency, hypoglycemia, skin rash, visual loss, sob, diarrhea, rhinorrhea, easy bruising, LOC, and depression.  She has no menses due to TAH.      Objective:   Physical Exam VS: see vs page GEN: no distress HEAD: head: no deformity eyes: no periorbital swelling, no proptosis external nose and ears are normal mouth: no lesion seen NECK: supple, thyroid is not enlarged CHEST WALL: no deformity LUNGS:  Clear to auscultation. CV: reg rate and rhythm, no murmur ABD: abdomen is soft, nontender.  no hepatosplenomegaly.  not distended.  no hernia MUSCULOSKELETAL: muscle bulk and strength are grossly normal.  no obvious  joint swelling.  gait is normal and steady EXTEMITIES: no deformity.  no edema PULSES: dorsalis pedis intact bilat.   NEURO:  cn 2-12 grossly intact.   readily moves all 4's.  sensation is intact to touch on the feet SKIN:  Normal texture and temperature.  No rash or suspicious lesion is visible.   NODES:  None palpable at the neck PSYCH: alert, oriented x3.  Does not appear anxious nor depressed. Lab Results  Component Value Date   PTH 35.0 07/02/2012   CALCIUM 10.5 07/02/2012   CAION 1.35* 05/08/2012   PHOS 3.9 07/02/2012      Assessment & Plan:  Hypercalcemia, mild, uncertain etiology Bipolar disorder--does not seem related to hypercalcemia, as she has not been on Li++ HTN.  well-controlled off hctz.

## 2012-07-02 NOTE — Patient Instructions (Addendum)
blood tests are being requested for you today.  You will receive a letter with results. If these tests are good, you need only recheck the calcium once or twice a year. If the calcium goes over 11, we can check for a cause again.

## 2012-07-03 ENCOUNTER — Ambulatory Visit: Payer: 59 | Admitting: Internal Medicine

## 2012-07-03 ENCOUNTER — Encounter: Payer: Self-pay | Admitting: Endocrinology

## 2012-07-03 LAB — PTH, INTACT AND CALCIUM: Calcium, Total (PTH): 10.5 mg/dL (ref 8.4–10.5)

## 2012-07-21 ENCOUNTER — Encounter: Payer: Self-pay | Admitting: Internal Medicine

## 2012-07-21 ENCOUNTER — Ambulatory Visit (INDEPENDENT_AMBULATORY_CARE_PROVIDER_SITE_OTHER): Payer: 59 | Admitting: Internal Medicine

## 2012-07-21 VITALS — BP 124/82 | HR 76 | Temp 97.9°F | Resp 16 | Wt 198.2 lb

## 2012-07-21 DIAGNOSIS — K529 Noninfective gastroenteritis and colitis, unspecified: Secondary | ICD-10-CM

## 2012-07-21 DIAGNOSIS — R05 Cough: Secondary | ICD-10-CM

## 2012-07-21 DIAGNOSIS — E785 Hyperlipidemia, unspecified: Secondary | ICD-10-CM

## 2012-07-21 DIAGNOSIS — K5289 Other specified noninfective gastroenteritis and colitis: Secondary | ICD-10-CM

## 2012-07-21 MED ORDER — ONDANSETRON HCL 4 MG PO TABS: 4.0000 mg | ORAL_TABLET | Freq: Three times a day (TID) | ORAL | Status: AC | PRN

## 2012-07-21 MED ORDER — ALBUTEROL SULFATE HFA 108 (90 BASE) MCG/ACT IN AERS: 2.0000 | INHALATION_SPRAY | Freq: Four times a day (QID) | RESPIRATORY_TRACT | Status: DC | PRN

## 2012-07-21 NOTE — Patient Instructions (Addendum)
Please schedule labs in approximately one week- chem7, lft-v58.69 Also please schedule chem7-v58.69 and lipid/lft-272.4 prior to next visit

## 2012-07-23 ENCOUNTER — Telehealth: Payer: Self-pay | Admitting: Internal Medicine

## 2012-07-23 ENCOUNTER — Other Ambulatory Visit: Payer: Self-pay | Admitting: *Deleted

## 2012-07-23 DIAGNOSIS — Z79899 Other long term (current) drug therapy: Secondary | ICD-10-CM

## 2012-07-23 DIAGNOSIS — E785 Hyperlipidemia, unspecified: Secondary | ICD-10-CM

## 2012-07-23 NOTE — Telephone Encounter (Signed)
Please schedule labs in approximately one week- chem7, lft-v58.69  ---patient will be going to [facility] lab tomorrow  Also please schedule chem7-v58.69 and lipid/lft-272.4 prior to next visit ---patient has upcoming appointment in march/2014. She will be going to [facility] lab

## 2012-07-24 LAB — BASIC METABOLIC PANEL
BUN: 13 mg/dL (ref 6–23)
Chloride: 105 mEq/L (ref 96–112)
Creat: 0.71 mg/dL (ref 0.50–1.10)
Glucose, Bld: 104 mg/dL — ABNORMAL HIGH (ref 70–99)

## 2012-07-24 NOTE — Telephone Encounter (Signed)
Lab orders released/SLS Future orders placed in EMR/SLS

## 2012-07-25 DIAGNOSIS — R05 Cough: Secondary | ICD-10-CM | POA: Insufficient documentation

## 2012-07-25 NOTE — Assessment & Plan Note (Signed)
Recent outside lipid panel. Obtain liver function tests

## 2012-07-25 NOTE — Assessment & Plan Note (Signed)
Attempt Zofran when necessary. Worked up for. Followup no improvement or worsening.

## 2012-07-25 NOTE — Progress Notes (Signed)
  Subjective:    Patient ID: Yvonne Rice, female    DOB: Dec 11, 1960, 51 y.o.   MRN: [id number]  HPI  patient presents to clinic for evaluation of cough. No sore throat with nonproductive cough as well as nausea vomiting and watery diarrhea without blood. Duration approximately 2 days. Blease B. upper rest her symptoms may be provoked by dust exposure. Requests albuterol MDI when necessary. Reviewed outside labs dated August 2013 with total cholesterol a hundred and seventy-one HDL thirty-one LDL seventy-six with A1c of 5.9. Has quit tobacco. No alleviating or exacerbating factors  Past Medical History  Diagnosis Date  . Hypertension   . Depression   . Arthritis    Past Surgical History  Procedure Date  . Knee surgery     reports that she has quit smoking. Her smoking use included Cigarettes. She smoked .8 packs per day. She has never used smokeless tobacco. She reports that she does not drink alcohol or use illicit drugs. family history includes Cancer in her mother and paternal grandmother and Heart disease in her father. Allergies  Allergen Reactions  . Sulfa Drugs Cross Reactors Hives     Review of Systems see history of present illness     Objective:   Physical Exam  Constitutional: She appears well-developed and well-nourished. No distress.  HENT:  Head: Normocephalic and atraumatic.  Mouth/Throat: Oropharynx is clear and moist.  Eyes: Conjunctivae normal are normal. No scleral icterus.  Neck: Neck supple.  Cardiovascular: Normal rate, regular rhythm and normal heart sounds.  Exam reveals no gallop and no friction rub.   No murmur heard. Pulmonary/Chest: Effort normal and breath sounds normal. No respiratory distress. She has no wheezes. She has no rales.  Abdominal: Soft. Bowel sounds are normal. She exhibits no distension. There is no tenderness. There is no rebound.  Neurological: She is alert.  Skin: Skin is warm and dry. She is not diaphoretic.  Psychiatric: She  has a normal mood and affect.          Assessment & Plan:

## 2012-07-25 NOTE — Assessment & Plan Note (Signed)
Yvonne Rice H2 possible dust exposure. Provide albuterol MDI when necessary.

## 2012-09-04 ENCOUNTER — Telehealth: Payer: Self-pay | Admitting: Internal Medicine

## 2012-09-04 MED ORDER — ALBUTEROL SULFATE HFA 108 (90 BASE) MCG/ACT IN AERS: 2.0000 | INHALATION_SPRAY | Freq: Four times a day (QID) | RESPIRATORY_TRACT | Status: DC | PRN

## 2012-09-04 NOTE — Telephone Encounter (Signed)
Rx to pharmacy/SLS 

## 2012-09-04 NOTE — Telephone Encounter (Signed)
Refill-proventil hfa inh(200 puffs) 6.7gm. Inhale 2 puffs into the lungs every 6hrs as needed for wheezing. Qty 6.7 last fill 11.9.63

## 2012-11-12 ENCOUNTER — Ambulatory Visit (INDEPENDENT_AMBULATORY_CARE_PROVIDER_SITE_OTHER): Payer: 59 | Admitting: Internal Medicine

## 2012-11-12 ENCOUNTER — Ambulatory Visit (INDEPENDENT_AMBULATORY_CARE_PROVIDER_SITE_OTHER): Payer: 59

## 2012-11-12 DIAGNOSIS — Z23 Encounter for immunization: Secondary | ICD-10-CM

## 2012-11-12 NOTE — Progress Notes (Signed)
Patient ID: Yvonne Rice, female   DOB: 25-Aug-1961, 52 y.o.   MRN: [id number]  The patient presented to the office for flu vaccine. Inquired about getting tdap and pneumovax as she has a new grandbaby coming in 1-2 weeks. Per verbal from Provider, ok to administer both vaccines.

## 2012-11-24 ENCOUNTER — Other Ambulatory Visit: Payer: Self-pay | Admitting: Internal Medicine

## 2012-11-24 NOTE — Telephone Encounter (Signed)
Rx to pharmacy/SLS 

## 2012-11-24 NOTE — Telephone Encounter (Signed)
30

## 2012-11-24 NOTE — Telephone Encounter (Signed)
Tramadol request [last Rx 02.22.13 #30x3]/SLS Please advise.

## 2013-01-11 LAB — BASIC METABOLIC PANEL
CO2: 27 mEq/L (ref 19–32)
Calcium: 10.3 mg/dL (ref 8.4–10.5)
Creat: 0.75 mg/dL (ref 0.50–1.10)
Glucose, Bld: 104 mg/dL — ABNORMAL HIGH (ref 70–99)
Sodium: 140 mEq/L (ref 135–145)

## 2013-01-11 LAB — LIPID PANEL: Cholesterol: 199 mg/dL (ref 0–200)

## 2013-01-11 LAB — HEPATIC FUNCTION PANEL
ALT: 16 U/L (ref 0–35)
AST: 17 U/L (ref 0–37)
Alkaline Phosphatase: 104 U/L (ref 39–117)
Bilirubin, Direct: <0.1 mg/dL (ref 0.0–0.3)
Total Bilirubin: 0.3 mg/dL (ref 0.3–1.2)

## 2013-01-11 NOTE — Telephone Encounter (Signed)
Pt presented to the lab, future orders released. 

## 2013-01-11 NOTE — Addendum Note (Signed)
Addended by: Mervin Kung A on: 01/11/2013 08:55 AM   Modules accepted: Orders

## 2013-01-18 ENCOUNTER — Encounter: Payer: Self-pay | Admitting: Family Medicine

## 2013-01-18 ENCOUNTER — Ambulatory Visit (INDEPENDENT_AMBULATORY_CARE_PROVIDER_SITE_OTHER): Payer: 59 | Admitting: Family Medicine

## 2013-01-18 VITALS — BP 120/82 | HR 75 | Temp 97.6°F | Ht 63.75 in | Wt 198.0 lb

## 2013-01-18 DIAGNOSIS — M1712 Unilateral primary osteoarthritis, left knee: Secondary | ICD-10-CM

## 2013-01-18 DIAGNOSIS — M1710 Unilateral primary osteoarthritis, unspecified knee: Secondary | ICD-10-CM

## 2013-01-18 DIAGNOSIS — I10 Essential (primary) hypertension: Secondary | ICD-10-CM

## 2013-01-18 DIAGNOSIS — M25569 Pain in unspecified knee: Secondary | ICD-10-CM

## 2013-01-18 DIAGNOSIS — F17200 Nicotine dependence, unspecified, uncomplicated: Secondary | ICD-10-CM

## 2013-01-18 DIAGNOSIS — E785 Hyperlipidemia, unspecified: Secondary | ICD-10-CM

## 2013-01-18 DIAGNOSIS — M25562 Pain in left knee: Secondary | ICD-10-CM

## 2013-01-18 MED ORDER — TRAMADOL HCL 50 MG PO TABS: 50.0000 mg | ORAL_TABLET | Freq: Four times a day (QID) | ORAL | Status: DC | PRN

## 2013-01-18 MED ORDER — KRILL OIL PO CAPS: ORAL_CAPSULE | ORAL | Status: DC

## 2013-01-18 MED ORDER — PRAVASTATIN SODIUM 10 MG PO TABS: 10.0000 mg | ORAL_TABLET | Freq: Every day | ORAL | Status: DC

## 2013-01-18 MED ORDER — SULINDAC 200 MG PO TABS: 200.0000 mg | ORAL_TABLET | Freq: Two times a day (BID) | ORAL | Status: DC

## 2013-01-18 NOTE — Patient Instructions (Addendum)
Labs prior to renal, hepatic, tsh, cbc, lipid  Cholesterol Cholesterol is a white, waxy, fat-like protein needed by your body in small amounts. The liver makes all the cholesterol you need. It is carried from the liver by the blood through the blood vessels. Deposits (plaque) may build up on blood vessel walls. This makes the arteries narrower and stiffer. Plaque increases the risk for heart attack and stroke. You cannot feel your cholesterol level even if it is very high. The only way to know is by a blood test to check your lipid (fats) levels. Once you know your cholesterol levels, you should keep a record of the test results. Work with your caregiver to to keep your levels in the desired range. WHAT THE RESULTS MEAN:  Total cholesterol is a rough measure of all the cholesterol in your blood.  LDL is the so-called bad cholesterol. This is the type that deposits cholesterol in the walls of the arteries. You want this level to be low.  HDL is the good cholesterol because it cleans the arteries and carries the LDL away. You want this level to be high.  Triglycerides are fat that the body can either burn for energy or store. High levels are closely linked to heart disease. DESIRED LEVELS:  Total cholesterol below 200.  LDL below 100 for people at risk, below 70 for very high risk.  HDL above 50 is good, above 60 is best.  Triglycerides below 150. HOW TO LOWER YOUR CHOLESTEROL:  Diet.  Choose fish or white meat chicken and [place], roasted or baked. Limit fatty cuts of red meat, fried foods, and processed meats, such as sausage and lunch meat.  Eat lots of fresh fruits and vegetables. Choose whole grains, beans, pasta, potatoes and cereals.  Use only small amounts of olive, corn or canola oils. Avoid butter, mayonnaise, shortening or palm kernel oils. Avoid foods with trans-fats.  Use skim/nonfat milk and low-fat/nonfat yogurt and cheeses. Avoid whole milk, cream, ice cream, egg yolks  and cheeses. Healthy desserts include angel food cake, ginger snaps, animal crackers, hard candy, popsicles, and low-fat/nonfat frozen yogurt. Avoid pastries, cakes, pies and cookies.  Exercise.  A regular program helps decrease LDL and raises HDL.  Helps with weight control.  Do things that increase your activity level like gardening, walking, or taking the stairs.  Medication.  May be prescribed by your caregiver to help lowering cholesterol and the risk for heart disease.  You may need medicine even if your levels are normal if you have several risk factors. HOME CARE INSTRUCTIONS   Follow your diet and exercise programs as suggested by your caregiver.  Take medications as directed.  Have blood work done when your caregiver feels it is necessary. MAKE SURE YOU:   Understand these instructions.  Will watch your condition.  Will get help right away if you are not doing well or get worse. Document Released: 07/23/2001 Document Revised: 01/20/2012 Document Reviewed: 01/13/2008 [facility] Patient Information 2013 [place], [place].

## 2013-01-19 ENCOUNTER — Other Ambulatory Visit: Payer: Self-pay

## 2013-01-19 DIAGNOSIS — Z1231 Encounter for screening mammogram for malignant neoplasm of breast: Secondary | ICD-10-CM

## 2013-01-23 ENCOUNTER — Encounter: Payer: Self-pay | Admitting: Family Medicine

## 2013-01-23 DIAGNOSIS — M25562 Pain in left knee: Secondary | ICD-10-CM

## 2013-01-23 HISTORY — DX: Pain in left knee: M25.562

## 2013-01-23 NOTE — Assessment & Plan Note (Signed)
Pravachol 10 mg daily and krilloil daily, avoid trans fats, increase exercise.

## 2013-01-23 NOTE — Assessment & Plan Note (Signed)
Adequately controlled, no changes to meds.

## 2013-01-23 NOTE — Progress Notes (Signed)
Patient ID: Yvonne Rice, female   DOB: Aug 08, 1961, 52 y.o.   MRN: [id number] TINESHIA BECRAFT [id number] [birth year] 01/23/2013      Progress Note-Follow Up  Subjective  Chief Complaint  Chief Complaint  Patient presents with  . Follow-up    6 month    HPI  Is a 52 year old female in today for followup. Overall she's too well but she did not do she is having more trouble with her left knee. She injured it back in 1988 and is actually had multiple surgeries. Follows with Dr. Turner Daniels at [facility] orthopedics recently this been having more pain. No significant redness warmth or swelling is noted. No recent new trauma. No other acute complaints noted today. Did have her last colonoscopy roughly 2 years ago which was normal. No fevers or chills. No headaches or chest pains. No GI or GU complaints. No shortness of breath or acute concerns noted  Past Medical History  Diagnosis Date  . Hypertension   . Depression   . Arthritis   . Left knee pain 01/23/2013    Past Surgical History  Procedure Laterality Date  . Knee surgery      Family History  Problem Relation Age of Onset  . Cancer Mother     colon  . Heart disease Father   . Cancer Paternal Grandmother     ovarian, uterine, breast    History   Social History  . Marital Status: Divorced    Spouse Name: N/A    Number of Children: N/A  . Years of Education: N/A   Occupational History  . Not on file.   Social History Main Topics  . Smoking status: Former Smoker -- 0.80 packs/day    Types: Cigarettes  . Smokeless tobacco: Never Used     Comment: No Nicotine Electric Cigarettes  . Alcohol Use: No  . Drug Use: No  . Sexually Active: Not on file   Other Topics Concern  . Not on file   Social History Narrative  . No narrative on file    No current outpatient prescriptions on file prior to visit.   No current facility-administered medications on file prior to visit.    Allergies  Allergen Reactions  . Sulfa  Drugs Cross Reactors Hives    Review of Systems  Review of Systems  Constitutional: Negative for fever and malaise/fatigue.  HENT: Negative for congestion.   Eyes: Negative for discharge.  Respiratory: Negative for shortness of breath.   Cardiovascular: Negative for chest pain, palpitations and leg swelling.  Gastrointestinal: Negative for nausea, abdominal pain and diarrhea.  Genitourinary: Negative for dysuria.  Musculoskeletal: Negative for falls.  Skin: Negative for rash.  Neurological: Negative for loss of consciousness and headaches.  Endo/Heme/Allergies: Negative for polydipsia.  Psychiatric/Behavioral: Negative for depression and suicidal ideas. The patient is not nervous/anxious and does not have insomnia.     Objective  BP 120/82  Pulse 75  Temp(Src) 97.6 F (36.4 C) (Oral)  Ht 5' 3.75" (1.619 m)  Wt 198 lb 0.6 oz (89.83 kg)  BMI 34.27 kg/m2  SpO2 94%  Physical Exam  Physical Exam  Constitutional: She is oriented to person, place, and time and well-developed, well-nourished, and in no distress. No distress.  HENT:  Head: Normocephalic and atraumatic.  Eyes: Conjunctivae are normal.  Neck: Neck supple. No thyromegaly present.  Cardiovascular: Normal rate, regular rhythm and normal heart sounds.   No murmur heard. Pulmonary/Chest: Effort normal and breath sounds normal. She has no wheezes.  Abdominal: She exhibits no distension and no mass.  Musculoskeletal: She exhibits no edema.  Lymphadenopathy:    She has no cervical adenopathy.  Neurological: She is alert and oriented to person, place, and time.  Skin: Skin is warm and dry. No rash noted. She is not diaphoretic.  Psychiatric: Memory, affect and judgment normal.    Lab Results  Component Value Date   TSH 1.02 07/02/2012   Lab Results  Component Value Date   WBC 8.0 05/08/2012   HGB 14.5 05/08/2012   HCT 41.7 05/08/2012   MCV 87.4 05/08/2012   PLT 274 05/08/2012   Lab Results  Component Value Date    CREATININE 0.75 01/11/2013   BUN 12 01/11/2013   NA 140 01/11/2013   K 4.5 01/11/2013   CL 104 01/11/2013   CO2 27 01/11/2013   Lab Results  Component Value Date   ALT 16 01/11/2013   AST 17 01/11/2013   ALKPHOS 104 01/11/2013   BILITOT 0.3 01/11/2013   Lab Results  Component Value Date   CHOL 199 01/11/2013   Lab Results  Component Value Date   HDL 53 01/11/2013   Lab Results  Component Value Date   LDLCALC 115* 01/11/2013   Lab Results  Component Value Date   TRIG 153* 01/11/2013   Lab Results  Component Value Date   CHOLHDL 3.8 01/11/2013     Assessment & Plan  HTN (hypertension) Adequately controlled, no changes to meds.  Left knee pain Has stiffness and pain intermittently. Has tramadol to use prn and consider Aspercreme prn  Tobacco use disorder Encouraged cessation.   Other and unspecified hyperlipidemia Pravachol 10 mg daily and krilloil daily, avoid trans fats, increase exercise.

## 2013-01-23 NOTE — Assessment & Plan Note (Signed)
Encouraged cessation.

## 2013-01-23 NOTE — Assessment & Plan Note (Addendum)
Has stiffness and pain intermittently. Has tramadol to use prn and consider Aspercreme prn

## 2013-02-10 ENCOUNTER — Ambulatory Visit
Admission: RE | Admit: 2013-02-10 | Discharge: 2013-02-10 | Disposition: A | Discharge status: Home / Self Care | Payer: 59 | Source: Ambulatory Visit

## 2013-02-10 DIAGNOSIS — Z1231 Encounter for screening mammogram for malignant neoplasm of breast: Secondary | ICD-10-CM

## 2013-07-14 ENCOUNTER — Telehealth: Payer: Self-pay

## 2013-07-14 DIAGNOSIS — E785 Hyperlipidemia, unspecified: Secondary | ICD-10-CM

## 2013-07-14 DIAGNOSIS — I10 Essential (primary) hypertension: Secondary | ICD-10-CM

## 2013-07-14 LAB — HEPATIC FUNCTION PANEL
ALT: 15 U/L (ref 0–35)
Bilirubin, Direct: 0.1 mg/dL (ref 0.0–0.3)
Indirect Bilirubin: 0.2 mg/dL (ref 0.0–0.9)
Total Protein: 6.8 g/dL (ref 6.0–8.3)

## 2013-07-14 LAB — LIPID PANEL
HDL: 49 mg/dL (ref >39)
LDL Cholesterol: 107 mg/dL — ABNORMAL HIGH (ref 0–99)
Total CHOL/HDL Ratio: 4.3 Ratio
Triglycerides: 269 mg/dL — ABNORMAL HIGH (ref <150)

## 2013-07-14 LAB — CBC
MCH: 30.6 pg (ref 26.0–34.0)
Platelets: 245 10*3/uL (ref 150–400)
RBC: 4.74 MIL/uL (ref 3.87–5.11)
RDW: 13.3 % (ref 11.5–15.5)
WBC: 6.9 10*3/uL (ref 4.0–10.5)

## 2013-07-14 LAB — RENAL FUNCTION PANEL
Albumin: 4.6 g/dL (ref 3.5–5.2)
Calcium: 9.9 mg/dL (ref 8.4–10.5)
Creat: 0.72 mg/dL (ref 0.50–1.10)
Sodium: 141 mEq/L (ref 135–145)

## 2013-07-14 NOTE — Telephone Encounter (Signed)
Lab order placed.

## 2013-07-15 LAB — TSH: TSH: 2.456 u[IU]/mL (ref 0.350–4.500)

## 2013-07-20 ENCOUNTER — Ambulatory Visit (INDEPENDENT_AMBULATORY_CARE_PROVIDER_SITE_OTHER): Payer: 59 | Admitting: Family Medicine

## 2013-07-20 ENCOUNTER — Encounter: Payer: Self-pay | Admitting: Family Medicine

## 2013-07-20 ENCOUNTER — Telehealth: Payer: Self-pay | Admitting: Internal Medicine

## 2013-07-20 VITALS — BP 148/94 | HR 78 | Temp 98.1°F | Ht 63.75 in | Wt 206.0 lb

## 2013-07-20 DIAGNOSIS — M25569 Pain in unspecified knee: Secondary | ICD-10-CM

## 2013-07-20 DIAGNOSIS — I10 Essential (primary) hypertension: Secondary | ICD-10-CM

## 2013-07-20 DIAGNOSIS — F329 Major depressive disorder, single episode, unspecified: Secondary | ICD-10-CM

## 2013-07-20 DIAGNOSIS — M25562 Pain in left knee: Secondary | ICD-10-CM

## 2013-07-20 DIAGNOSIS — F341 Dysthymic disorder: Secondary | ICD-10-CM

## 2013-07-20 DIAGNOSIS — R7302 Impaired glucose tolerance (oral): Secondary | ICD-10-CM

## 2013-07-20 DIAGNOSIS — F32A Depression, unspecified: Secondary | ICD-10-CM

## 2013-07-20 DIAGNOSIS — Z0000 Encounter for general adult medical examination without abnormal findings: Secondary | ICD-10-CM

## 2013-07-20 DIAGNOSIS — E785 Hyperlipidemia, unspecified: Secondary | ICD-10-CM

## 2013-07-20 DIAGNOSIS — F17200 Nicotine dependence, unspecified, uncomplicated: Secondary | ICD-10-CM

## 2013-07-20 MED ORDER — PRAVASTATIN SODIUM 20 MG PO TABS: 20.0000 mg | ORAL_TABLET | Freq: Every day | ORAL | Status: DC

## 2013-07-20 MED ORDER — LORAZEPAM 0.5 MG PO TABS: 0.5000 mg | ORAL_TABLET | Freq: Two times a day (BID) | ORAL | Status: DC | PRN

## 2013-07-20 MED ORDER — ESCITALOPRAM OXALATE 10 MG PO TABS: 10.0000 mg | ORAL_TABLET | Freq: Every day | ORAL | Status: DC

## 2013-07-20 NOTE — Telephone Encounter (Signed)
Lipid, renal, hepatic tsh prior to visit   Patient has appointment in 10/2013 and will be going to [place] [facility]

## 2013-07-20 NOTE — Patient Instructions (Addendum)
DASH Diet  The DASH diet stands for "Dietary Approaches to Stop Hypertension." It is a healthy eating plan that has been shown to reduce high blood pressure (hypertension) in as little as 14 days, while also possibly providing other significant health benefits. These other health benefits include reducing the risk of breast cancer after menopause and reducing the risk of type 2 diabetes, heart disease, colon cancer, and stroke. Health benefits also include weight loss and slowing kidney failure in patients with chronic kidney disease.   DIET GUIDELINES  · Limit salt (sodium). Your diet should contain less than 1500 mg of sodium daily.  · Limit refined or processed carbohydrates. Your diet should include mostly whole grains. Desserts and added sugars should be used sparingly.  · Include small amounts of heart-healthy fats. These types of fats include nuts, oils, and tub margarine. Limit saturated and trans fats. These fats have been shown to be harmful in the body.  CHOOSING FOODS   The following food groups are based on a 2000 calorie diet. See your Registered Dietitian for individual calorie needs.  Grains and Grain Products (6 to 8 servings daily)  · Eat More Often: Whole-wheat bread, brown rice, whole-grain or wheat pasta, quinoa, popcorn without added fat or salt (air popped).  · Eat Less Often: White bread, white pasta, white rice, cornbread.  Vegetables (4 to 5 servings daily)  · Eat More Often: Fresh, frozen, and canned vegetables. Vegetables may be raw, steamed, roasted, or grilled with a minimal amount of fat.  · Eat Less Often/Avoid: Creamed or fried vegetables. Vegetables in a cheese sauce.  Fruit (4 to 5 servings daily)  · Eat More Often: All fresh, canned (in natural juice), or frozen fruits. Dried fruits without added sugar. One hundred percent fruit juice (½ cup [237 mL] daily).  · Eat Less Often: Dried fruits with added sugar. Canned fruit in light or heavy syrup.  Lean Meats, Fish, and Poultry (2  servings or less daily. One serving is 3 to 4 oz [85-114 g]).  · Eat More Often: Ninety percent or leaner ground beef, tenderloin, sirloin. Round cuts of beef, chicken breast, turkey breast. All fish. Grill, bake, or broil your meat. Nothing should be fried.  · Eat Less Often/Avoid: Fatty cuts of meat, turkey, or chicken leg, thigh, or wing. Fried cuts of meat or fish.  Dairy (2 to 3 servings)  · Eat More Often: Low-fat or fat-free milk, low-fat plain or light yogurt, reduced-fat or part-skim cheese.  · Eat Less Often/Avoid: Milk (whole, 2%). Whole milk yogurt. Full-fat cheeses.  Nuts, Seeds, and Legumes (4 to 5 servings per week)  · Eat More Often: All without added salt.  · Eat Less Often/Avoid: Salted nuts and seeds, canned beans with added salt.  Fats and Sweets (limited)  · Eat More Often: Vegetable oils, tub margarines without trans fats, sugar-free gelatin. Mayonnaise and salad dressings.  · Eat Less Often/Avoid: Coconut oils, palm oils, butter, stick margarine, cream, half and half, cookies, candy, pie.  FOR MORE INFORMATION  The Dash Diet Eating Plan: www.dashdiet.org  Document Released: 10/17/2011 Document Revised: 01/20/2012 Document Reviewed: 10/17/2011  Exit[employer]® Patient Information ©2014 [employer].

## 2013-07-21 ENCOUNTER — Encounter: Payer: Self-pay | Admitting: Family Medicine

## 2013-07-21 DIAGNOSIS — Z0000 Encounter for general adult medical examination without abnormal findings: Secondary | ICD-10-CM | POA: Insufficient documentation

## 2013-07-21 HISTORY — DX: Encounter for general adult medical examination without abnormal findings: Z00.00

## 2013-07-21 NOTE — Progress Notes (Signed)
Patient ID: Yvonne Rice, female   DOB: 1961/07/01, 53 y.o.   MRN: [id number] Yvonne Rice [id number] May 29, 1961 07/21/2013      Progress Note-Follow Up  Subjective  Chief Complaint  Chief Complaint  Patient presents with  . Annual Exam    phsical    HPI  Patient is a 52 year old female in today for annual exam. Generally feels well. Unfortunately continues to smoke 5 or 6 cigarettes a day. Denies shortness or breath, chest pain, palpitations, GI or GU concerns. Continues to struggle with left knee pain and is following with [facility]oped[facility]. Finds the pain manageable so does not choose to proceed with surgery. No recent illness. No headache or congestion  Past Medical History  Diagnosis Date  . Hypertension   . Depression   . Arthritis   . Left knee pain 01/23/2013    Past Surgical History  Procedure Laterality Date  . Knee surgery      Family History  Problem Relation Age of Onset  . Cancer Mother     colon  . Deep vein thrombosis Mother     previous smoker  . Peripheral vascular disease Mother   . Heart disease Father     MI  . Cancer Paternal Grandmother     ovarian, uterine, breast  . Mental illness Brother     PTSD  . Cancer Paternal Aunt     breast  . Polycystic ovary syndrome Daughter     History   Social History  . Marital Status: Divorced    Spouse Name: N/A    Number of Children: N/A  . Years of Education: N/A   Occupational History  . Not on file.   Social History Main Topics  . Smoking status: Light Tobacco Smoker -- 0.80 packs/day    Types: Cigarettes  . Smokeless tobacco: Never Used     Comment: No Nicotine Electric Cigarettes  . Alcohol Use: No  . Drug Use: No  . Sexual Activity: Not on file   Other Topics Concern  . Not on file   Social History Narrative  . No narrative on file    Current Outpatient Prescriptions on File Prior to Visit  Medication Sig Dispense Refill  . Krill Oil CAPS MegaRed caps daily  60  capsule  11  . sulindac (CLINORIL) 200 MG tablet Take 1 tablet (200 mg total) by mouth 2 (two) times daily.  180 tablet  1  . traMADol (ULTRAM) 50 MG tablet Take 1 tablet (50 mg total) by mouth every 6 (six) hours as needed.  120 tablet  1   No current facility-administered medications on file prior to visit.    Allergies  Allergen Reactions  . Sulfa Drugs Cross Reactors Hives    Review of Systems  Review of Systems  Constitutional: Negative for fever and malaise/fatigue.  HENT: Negative for congestion.   Eyes: Negative for discharge.  Respiratory: Negative for shortness of breath.   Cardiovascular: Negative for chest pain, palpitations and leg swelling.  Gastrointestinal: Negative for nausea, abdominal pain and diarrhea.  Genitourinary: Negative for dysuria.  Musculoskeletal: Negative for falls.  Skin: Negative for rash.  Neurological: Negative for loss of consciousness and headaches.  Endo/Heme/Allergies: Negative for polydipsia.  Psychiatric/Behavioral: Negative for depression and suicidal ideas. The patient is not nervous/anxious and does not have insomnia.     Objective  BP 153/100  Pulse 78  Temp(Src) 98.1 F (36.7 C) (Oral)  Ht 5' 3.75" (1.619 m)  Wt 206 lb  0.6 oz (93.459 kg)  BMI 35.66 kg/m2  SpO2 96%  Physical Exam  Physical Exam  Constitutional: She is oriented to person, place, and time and well-developed, well-nourished, and in no distress. No distress.  HENT:  Head: Normocephalic and atraumatic.  Eyes: Conjunctivae are normal.  Neck: Neck supple. No thyromegaly present.  Cardiovascular: Normal rate, regular rhythm and normal heart sounds.   No murmur heard. Pulmonary/Chest: Effort normal and breath sounds normal. She has no wheezes.  Abdominal: She exhibits no distension and no mass.  Musculoskeletal: She exhibits no edema.  Lymphadenopathy:    She has no cervical adenopathy.  Neurological: She is alert and oriented to person, place, and time.   Skin: Skin is warm and dry. No rash noted. She is not diaphoretic.  Psychiatric: Memory, affect and judgment normal.    Lab Results  Component Value Date   TSH 2.456 07/14/2013   Lab Results  Component Value Date   WBC 6.9 07/14/2013   HGB 14.5 07/14/2013   HCT 42.4 07/14/2013   MCV 89.5 07/14/2013   PLT 245 07/14/2013   Lab Results  Component Value Date   CREATININE 0.72 07/14/2013   BUN 13 07/14/2013   NA 141 07/14/2013   K 4.6 07/14/2013   CL 105 07/14/2013   CO2 27 07/14/2013   Lab Results  Component Value Date   ALT 15 07/14/2013   AST 14 07/14/2013   ALKPHOS 103 07/14/2013   BILITOT 0.3 07/14/2013   Lab Results  Component Value Date   CHOL 210* 07/14/2013   Lab Results  Component Value Date   HDL 49 07/14/2013   Lab Results  Component Value Date   LDLCALC 107* 07/14/2013   Lab Results  Component Value Date   TRIG 269* 07/14/2013   Lab Results  Component Value Date   CHOLHDL 4.3 07/14/2013     Assessment & Plan  HTN (hypertension) Improved some on recheck, encouraged DASH diet and increase activity, attempting weight loss. Call if concerning symptoms develop. Recheck at next visit.  Tobacco use disorder Down to a quarter pack per day. Encouraged complete cessation.  Left knee pain Try [place] prior to exercise stay as active as tolerable  Hypercalcemia Normal now.  Preventative health care Encouraged DASH diet, regular exercise, get 8 hours of sleep daily. Gets flu shot at work. Discussed colonoscopy, patient declines for now. Will consider. MGM was 4/14

## 2013-07-21 NOTE — Assessment & Plan Note (Signed)
Down to a quarter pack per day. Encouraged complete cessation.

## 2013-07-21 NOTE — Assessment & Plan Note (Signed)
Normal now 

## 2013-07-21 NOTE — Assessment & Plan Note (Signed)
Try Salon Pas prior to exercise stay as active as tolerable

## 2013-07-21 NOTE — Assessment & Plan Note (Signed)
Improved some on recheck, encouraged DASH diet and increase activity, attempting weight loss. Call if concerning symptoms develop. Recheck at next visit.

## 2013-07-24 NOTE — Assessment & Plan Note (Addendum)
Encouraged DASH diet, regular exercise, get 8 hours of sleep daily. Gets flu shot at work. Discussed colonoscopy, patient declines for now. Will consider. MGM was 4/14

## 2013-08-25 ENCOUNTER — Ambulatory Visit (HOSPITAL_BASED_OUTPATIENT_CLINIC_OR_DEPARTMENT_OTHER)
Admission: RE | Admit: 2013-08-25 | Discharge: 2013-08-25 | Disposition: A | Discharge status: Home / Self Care | Payer: 59 | Source: Ambulatory Visit | Attending: Family | Admitting: Family

## 2013-08-25 ENCOUNTER — Ambulatory Visit (INDEPENDENT_AMBULATORY_CARE_PROVIDER_SITE_OTHER): Payer: 59 | Admitting: Family

## 2013-08-25 ENCOUNTER — Encounter: Payer: Self-pay | Admitting: Family

## 2013-08-25 VITALS — BP 126/90 | HR 75 | Temp 97.8°F | Resp 16 | Ht 63.75 in | Wt 209.1 lb

## 2013-08-25 DIAGNOSIS — M7989 Other specified soft tissue disorders: Secondary | ICD-10-CM | POA: Insufficient documentation

## 2013-08-25 DIAGNOSIS — M79609 Pain in unspecified limb: Secondary | ICD-10-CM | POA: Insufficient documentation

## 2013-08-25 DIAGNOSIS — M25562 Pain in left knee: Secondary | ICD-10-CM

## 2013-08-25 DIAGNOSIS — M25569 Pain in unspecified knee: Secondary | ICD-10-CM

## 2013-08-25 DIAGNOSIS — E669 Obesity, unspecified: Secondary | ICD-10-CM | POA: Insufficient documentation

## 2013-08-25 NOTE — Progress Notes (Signed)
Subjective:    Patient ID: Yvonne Rice, female    DOB: 1961-01-02, 52 y.o.   MRN: [id number]  HPI  Ms. Yvonne Rice is a 52 yr old female who presents today with chief complaint of knee pain.  Past history of left knee pain: Injured 1998, 2006 lateral release with arthritis noted, 2013 surgery with Dr [facility]. Reorts that her dress code at work has changed from tennis shoes to dress shoes.  Reports left knee pain x 1 week since change of dress code. She sees Dr. Turner Daniels who is evaluating her for possible L TKR.  She does report some mild left calf tenderness and a family hx of DVT.      Review of Systems    see HPI  Past Medical History  Diagnosis Date  . Hypertension   . Depression   . Arthritis   . Left knee pain 01/23/2013  . Preventative health care 07/21/2013    History   Social History  . Marital Status: Divorced    Spouse Name: N/A    Number of Children: N/A  . Years of Education: N/A   Occupational History  . Not on file.   Social History Main Topics  . Smoking status: Light Tobacco Smoker -- 0.80 packs/day    Types: Cigarettes  . Smokeless tobacco: Never Used     Comment: No Nicotine Electric Cigarettes  . Alcohol Use: No  . Drug Use: No  . Sexual Activity: Not on file   Other Topics Concern  . Not on file   Social History Narrative  . No narrative on file    Past Surgical History  Procedure Laterality Date  . Knee surgery      Family History  Problem Relation Age of Onset  . Cancer Mother     colon  . Deep vein thrombosis Mother     previous smoker  . Peripheral vascular disease Mother   . Heart disease Father     MI  . Cancer Paternal Grandmother     ovarian, uterine, breast  . Mental illness Brother     PTSD  . Cancer Paternal Aunt     breast  . Polycystic ovary syndrome Daughter     Allergies  Allergen Reactions  . Sulfa Drugs Cross Reactors Hives    Current Outpatient Prescriptions on File Prior to Visit   Medication Sig Dispense Refill  . escitalopram (LEXAPRO) 10 MG tablet Take 1 tablet (10 mg total) by mouth daily.  30 tablet  3  . Krill Oil CAPS MegaRed caps daily  60 capsule  11  . LORazepam (ATIVAN) 0.5 MG tablet Take 1 tablet (0.5 mg total) by mouth 2 (two) times daily as needed for anxiety.  40 tablet  1  . pravastatin (PRAVACHOL) 20 MG tablet Take 1 tablet (20 mg total) by mouth daily.  90 tablet  3  . sulindac (CLINORIL) 200 MG tablet Take 1 tablet (200 mg total) by mouth 2 (two) times daily.  180 tablet  1  . traMADol (ULTRAM) 50 MG tablet Take 1 tablet (50 mg total) by mouth every 6 (six) hours as needed.  120 tablet  1   No current facility-administered medications on file prior to visit.    BP 126/90  Pulse 75  Temp(Src) 97.8 F (36.6 C) (Oral)  Resp 16  Ht 5' 3.75" (1.619 m)  Wt 209 lb 1.3 oz (94.838 kg)  BMI 36.18 kg/m2  SpO2 99%    Objective:  Physical Exam  Constitutional: She is oriented to person, place, and time. She appears well-developed and well-nourished. No distress.  HENT:  Head: Normocephalic and atraumatic.  Cardiovascular: Normal rate and regular rhythm.   No murmur heard. Pulmonary/Chest: Effort normal and breath sounds normal. No respiratory distress. She has no wheezes. She has no rales. She exhibits no tenderness.  Musculoskeletal:  Mild swelling of the left knee + swelling left foot, edema extends up to knee + swelling of left lateral ankle  Neurological: She is alert and oriented to person, place, and time.  Skin: Skin is warm and dry.  Psychiatric: She has a normal mood and affect. Her behavior is normal. Judgment and thought content normal.          Assessment & Plan:

## 2013-08-25 NOTE — Assessment & Plan Note (Signed)
Deteriorated. Now with left ankle pain and LLE swelling.  Will obtain LLE doppler to exclude DVT- though my suspicion is low.  She is maintained on sulindac and prn tramadol for pain.  Advised pt to arrange follow up with ortho.  A note was provided for work allowing her to wear tennis shoes to work.

## 2013-08-25 NOTE — Patient Instructions (Addendum)
Please complete ultrasound of the left leg on the first floor at 6:30 PM tonight.  Please arrange follow up with Dr. Turner Daniels.

## 2013-09-01 ENCOUNTER — Telehealth: Payer: Self-pay | Admitting: *Deleted

## 2013-09-01 NOTE — Telephone Encounter (Signed)
Left 2 messages for pt to return my call and mailed letter.

## 2013-09-01 NOTE — Telephone Encounter (Signed)
Message copied by Kathi Simpers on Wed Sep 01, 2013  4:45 PM ------      Message from: O'SULLIVAN, Amberle      Created: Wed Aug 25, 2013  8:33 PM       Neg for dvt. ------

## 2013-10-26 ENCOUNTER — Ambulatory Visit: Payer: 59 | Admitting: Family Medicine

## 2014-05-30 IMAGING — US US EXTREM LOW VENOUS*L*
1 series · 14 of 20 positions shown · non-contrast
Comparison: None.

CLINICAL DATA: left calf region pain and swelling

EXAM:
VENOUS DUPLEX ULTRASOUND OF LEFT LOWER EXTREMITY
TECHNIQUE: Gray-scale sonography with graded compression, as well as color
Doppler and duplex ultrasound, were performed to evaluate the deep
venous system from the level of the common femoral vein through the
popliteal and proximal calf veins. Spectral Doppler was utilized to
evaluate flow at rest and with distal augmentation maneuvers.

[Series 1: us extrem low venous*left* · 14 of 20 slices shown]
[im 1/20]
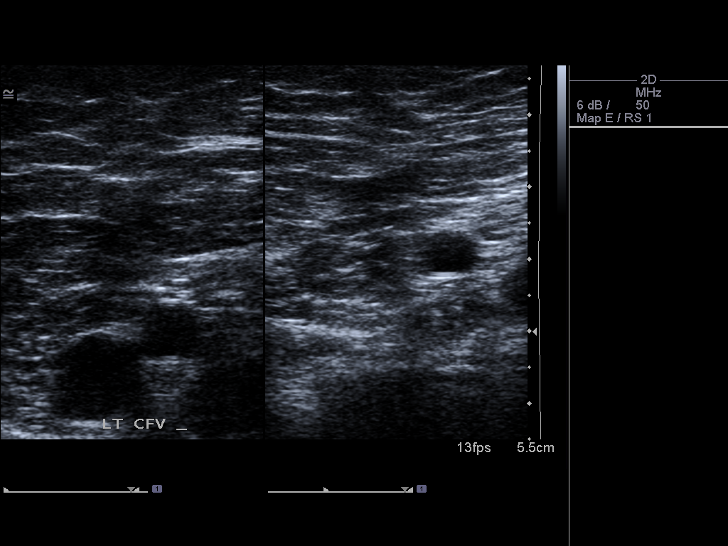
[im 3/20]
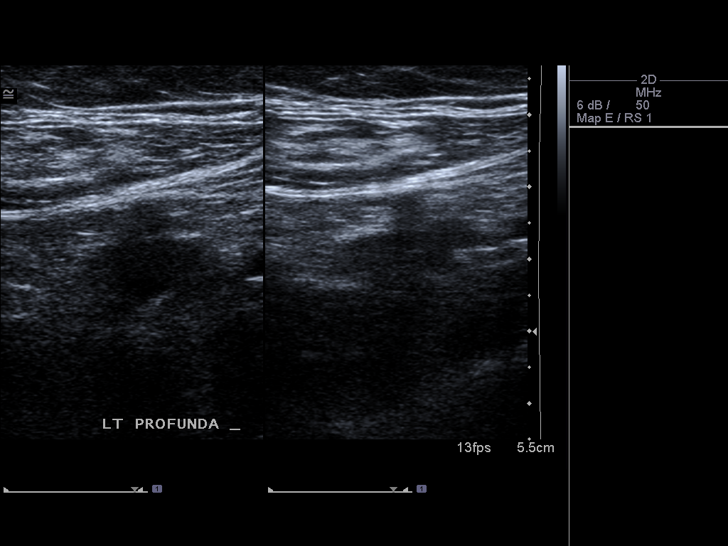
[im 4/20]
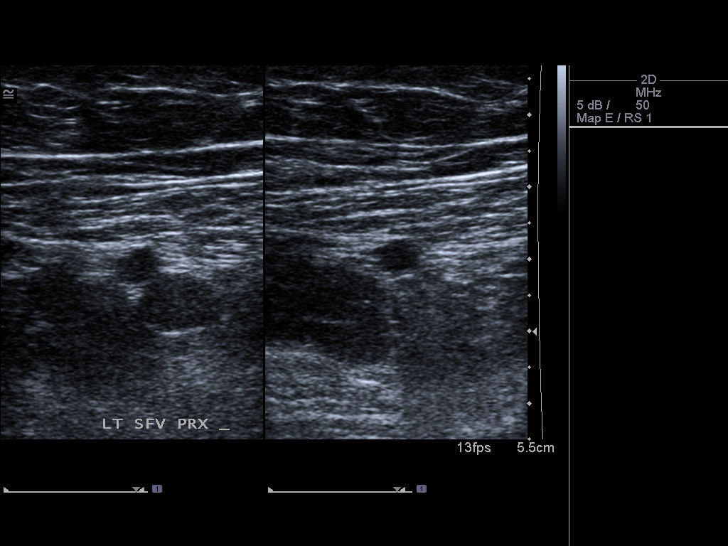
[im 6/20]
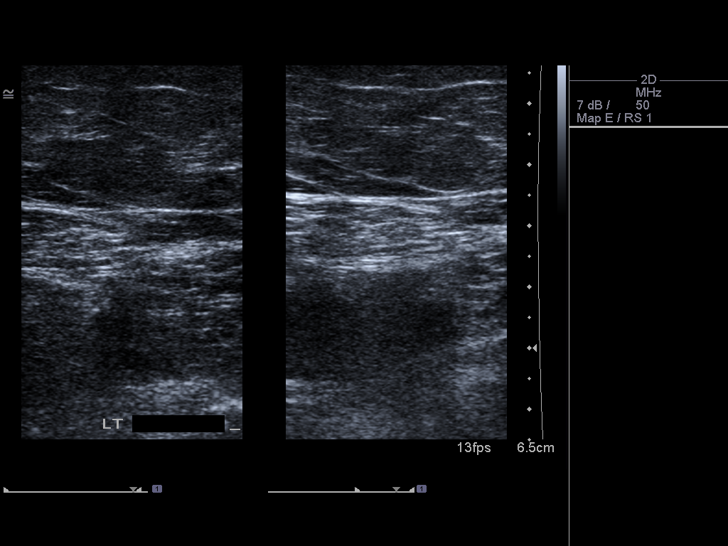
[im 7/20]
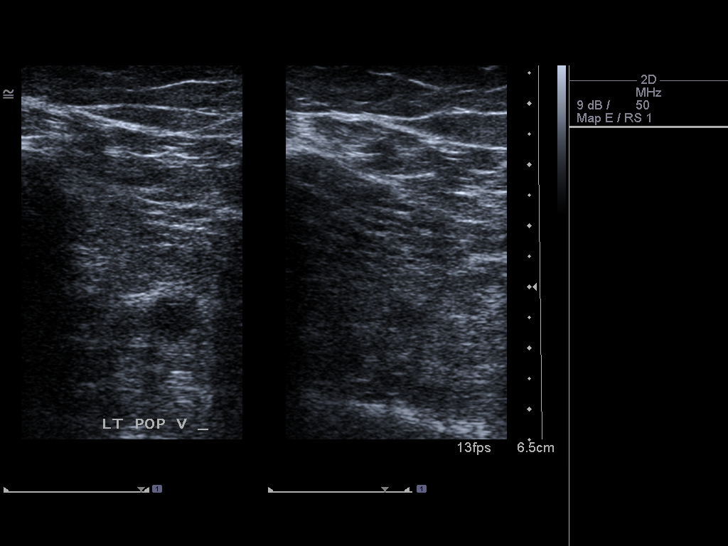
[im 8/20]
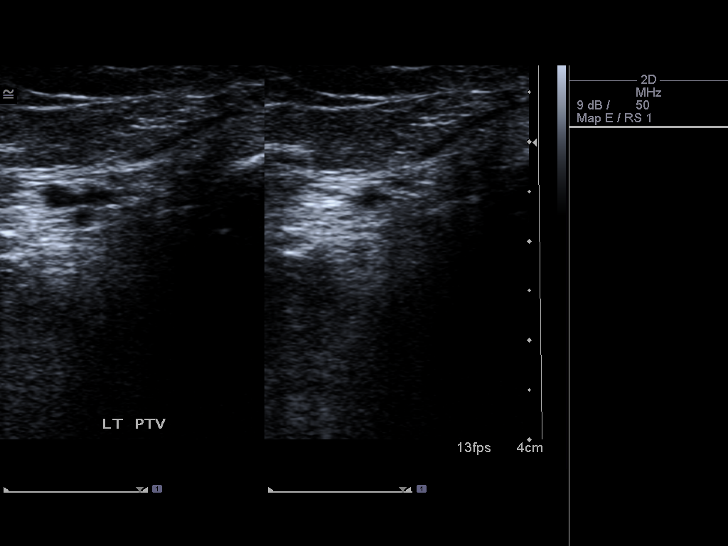
[im 10/20]
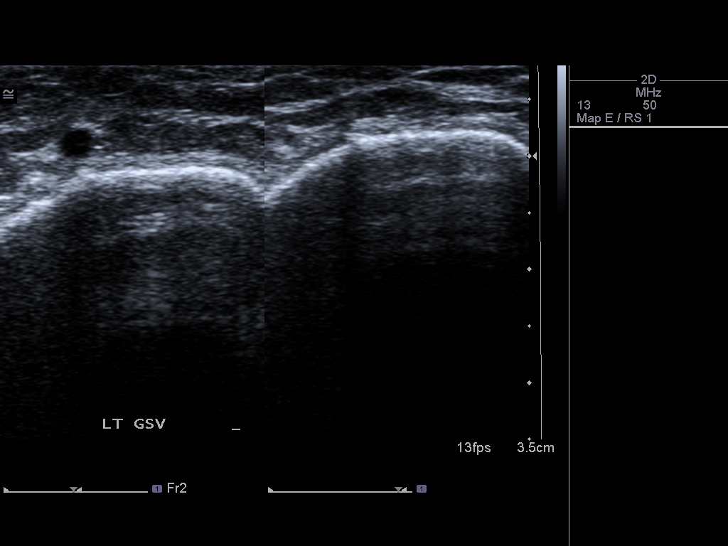
[im 11/20]
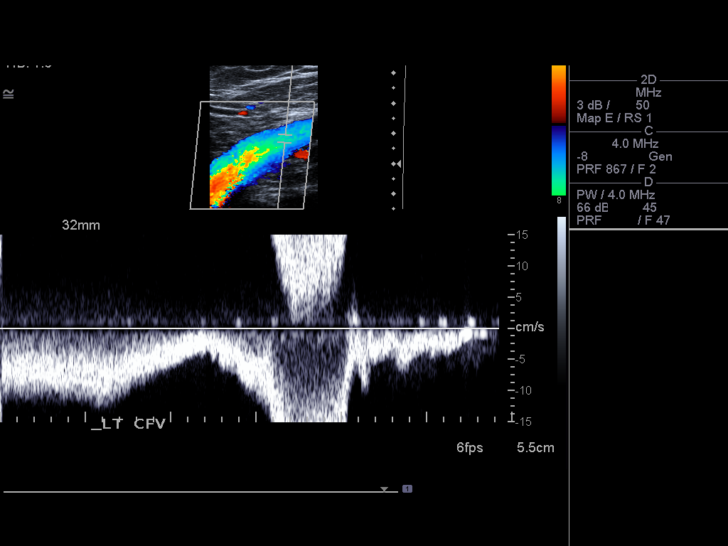
[im 13/20]
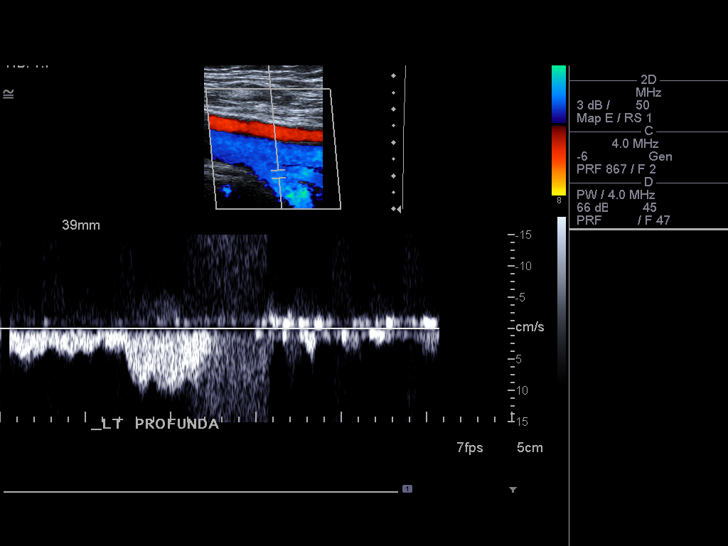
[im 14/20]
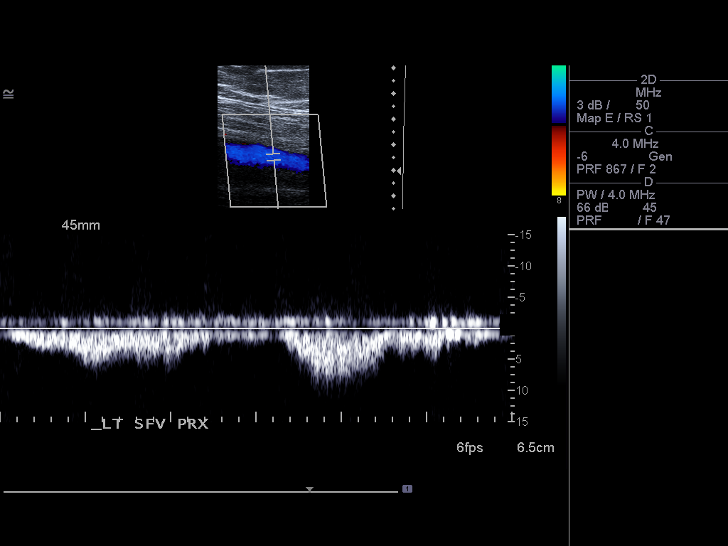
[im 16/20]
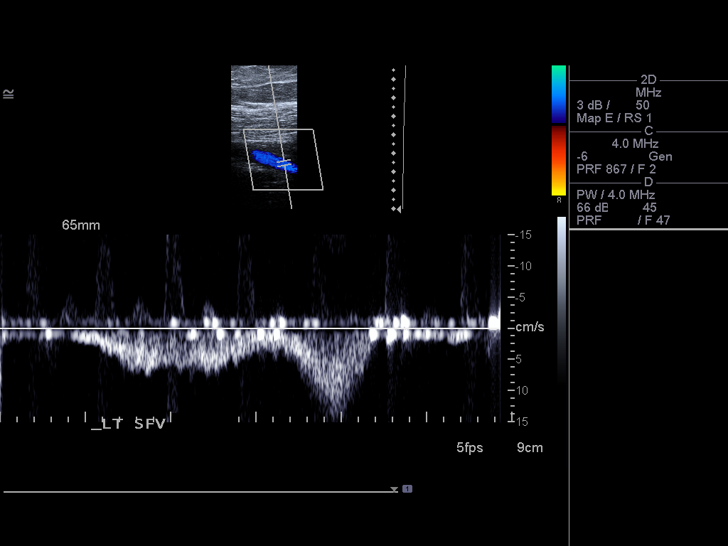
[im 17/20]
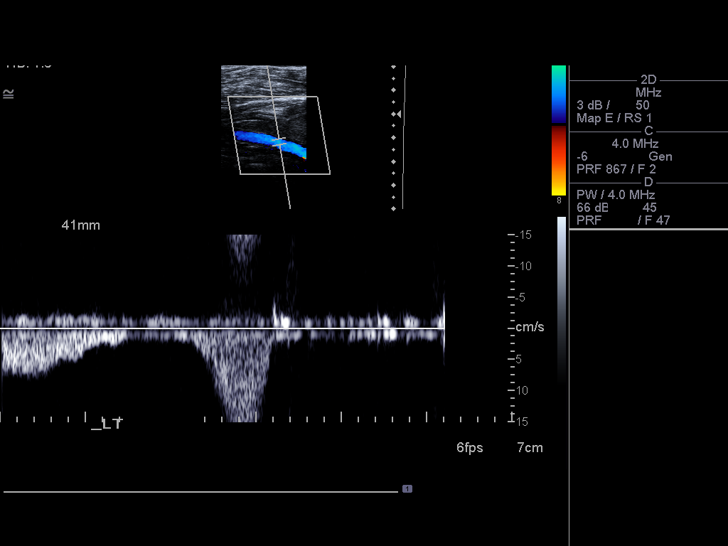
[im 18/20]
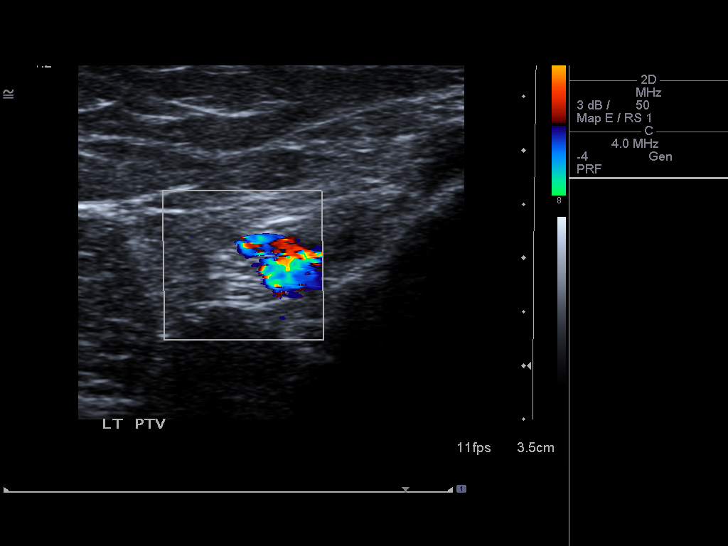
[im 20/20]
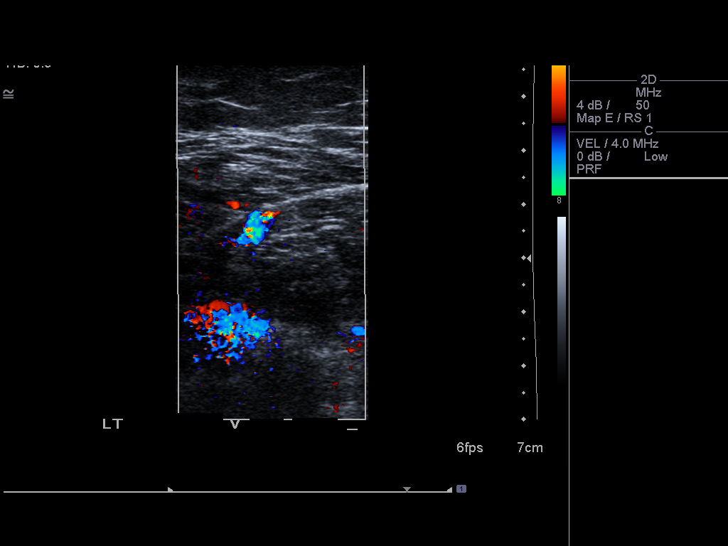

[14 of 20 positions shown; findings below may reference images not displayed]

FINDINGS: Flow in the venous structures of the left lower extremity is
spontaneous and phasic in all segments. There is normal compression
and augmentation in the venous structures of the left lower
extremity. Venous Doppler signal is normal in all regions. There is
no thrombus in the deep or visualized superficial venous structures
on the left. There is no left lower extremity deep venous
incompetence.
IMPRESSION: No evidence of left lower extremity deep venous thrombosis.

## 2014-12-23 ENCOUNTER — Ambulatory Visit (INDEPENDENT_AMBULATORY_CARE_PROVIDER_SITE_OTHER): Payer: 59 | Admitting: Family

## 2014-12-23 ENCOUNTER — Encounter: Payer: Self-pay | Admitting: Family

## 2014-12-23 VITALS — BP 136/77 | HR 69 | Temp 98.0°F | Wt 209.6 lb

## 2014-12-23 DIAGNOSIS — F4323 Adjustment disorder with mixed anxiety and depressed mood: Secondary | ICD-10-CM

## 2014-12-23 DIAGNOSIS — F32A Depression, unspecified: Secondary | ICD-10-CM

## 2014-12-23 DIAGNOSIS — F418 Other specified anxiety disorders: Secondary | ICD-10-CM

## 2014-12-23 DIAGNOSIS — M179 Osteoarthritis of knee, unspecified: Secondary | ICD-10-CM

## 2014-12-23 DIAGNOSIS — F419 Anxiety disorder, unspecified: Secondary | ICD-10-CM

## 2014-12-23 DIAGNOSIS — M1712 Unilateral primary osteoarthritis, left knee: Secondary | ICD-10-CM

## 2014-12-23 DIAGNOSIS — F329 Major depressive disorder, single episode, unspecified: Secondary | ICD-10-CM

## 2014-12-23 DIAGNOSIS — E785 Hyperlipidemia, unspecified: Secondary | ICD-10-CM

## 2014-12-23 LAB — LIPID PANEL
CHOLESTEROL: 246 mg/dL — AB (ref 0–200)
HDL: 40.1 mg/dL (ref >39.00)
Total CHOL/HDL Ratio: 6

## 2014-12-23 LAB — LDL CHOLESTEROL, DIRECT: Direct LDL: 174 mg/dL

## 2014-12-23 MED ORDER — ESCITALOPRAM OXALATE 10 MG PO TABS: ORAL_TABLET | ORAL | Status: DC

## 2014-12-23 MED ORDER — LORAZEPAM 0.5 MG PO TABS: 0.5000 mg | ORAL_TABLET | Freq: Two times a day (BID) | ORAL | Status: AC | PRN

## 2014-12-23 MED ORDER — SULINDAC 200 MG PO TABS: 200.0000 mg | ORAL_TABLET | Freq: Two times a day (BID) | ORAL | Status: AC

## 2014-12-23 MED ORDER — TRAMADOL HCL 50 MG PO TABS: 50.0000 mg | ORAL_TABLET | Freq: Four times a day (QID) | ORAL | Status: DC | PRN

## 2014-12-23 MED ORDER — VITAMIN C 500 MG PO TABS: 500.0000 mg | ORAL_TABLET | Freq: Every day | ORAL | Status: AC

## 2014-12-23 NOTE — Progress Notes (Signed)
Pre visit review using our clinic review tool, if applicable. No additional management support is needed unless otherwise documented below in the visit note. 

## 2014-12-23 NOTE — Patient Instructions (Signed)
Please complete lab work piror to leaving. Start lexapro 10mg , 1/2 tab by mouth once daily for 1 week, then increase to a full tab on week two. Contact psychiatry to initiate appointment:   Contact [facility] to [facility]. [phone]

## 2014-12-23 NOTE — Progress Notes (Signed)
Subjective:    Patient ID: Yvonne Rice, female    DOB: 07-Sep-1961, 54 y.o.   MRN: [id number]  HPI  Yvonne Rice is a 54 yr old female who presents today to discuss work stress. She has a history of bipolar disorder. Works for [employer].  Wants to transfer back to [place].  Reports that she has restless sleep. Alternated from not eating enough to eating too much. In the past she has been on prozac, ativan, buspar, lexapro in the past.  She was previously on lexapro.  She stopped lexapro due to money and changes with insurance.  Was doing better on the lexapro but stopped due to issues with insurance mail order etc.  Had quit smoking, but started back up due to stress. She has been in [place] x 4 years.  + panic attacks, occurs every few days or ago.  Denies SI/HI.     Review of Systems See HPI  Past Medical History  Diagnosis Date  . Hypertension   . Depression   . Arthritis   . Left knee pain 01/23/2013  . Preventative health care 07/21/2013    History   Social History  . Marital Status: Divorced    Spouse Name: N/A  . Number of Children: N/A  . Years of Education: N/A   Occupational History  . Not on file.   Social History Main Topics  . Smoking status: Current Every Day Smoker -- 1.00 packs/day    Types: Cigarettes  . Smokeless tobacco: Never Used     Comment: No Nicotine Electric Cigarettes  . Alcohol Use: 0.0 oz/week    0 Standard drinks or equivalent per week     Comment: occasional drink  . Drug Use: No  . Sexual Activity: Not on file   Other Topics Concern  . Not on file   Social History Narrative    Past Surgical History  Procedure Laterality Date  . Knee surgery      Family History  Problem Relation Age of Onset  . Cancer Mother     colon  . Deep vein thrombosis Mother     previous smoker  . Peripheral vascular disease Mother   . Heart disease Father     MI  . Cancer Paternal Grandmother     ovarian, uterine, breast  . Mental illness Brother    PTSD  . Cancer Paternal Aunt     breast  . Polycystic ovary syndrome Daughter     Allergies  Allergen Reactions  . Sulfa Drugs Cross Reactors Hives    Current Outpatient Prescriptions on File Prior to Visit  Medication Sig Dispense Refill  . Krill Oil CAPS MegaRed caps daily 60 capsule 11  . escitalopram (LEXAPRO) 10 MG tablet Take 1 tablet (10 mg total) by mouth daily. (Patient not taking: Reported on 12/23/2014) 30 tablet 3  . LORazepam (ATIVAN) 0.5 MG tablet Take 1 tablet (0.5 mg total) by mouth 2 (two) times daily as needed for anxiety. (Patient not taking: Reported on 12/23/2014) 40 tablet 1  . pravastatin (PRAVACHOL) 20 MG tablet Take 1 tablet (20 mg total) by mouth daily. (Patient not taking: Reported on 12/23/2014) 90 tablet 3  . sulindac (CLINORIL) 200 MG tablet Take 1 tablet (200 mg total) by mouth 2 (two) times daily. (Patient not taking: Reported on 12/23/2014) 180 tablet 1  . traMADol (ULTRAM) 50 MG tablet Take 1 tablet (50 mg total) by mouth every 6 (six) hours as needed. (Patient not taking: Reported  on 12/23/2014) 120 tablet 1   No current facility-administered medications on file prior to visit.    BP 136/77 mmHg  Pulse 69  Temp(Src) 98 F (36.7 C) (Oral)  Wt 209 lb 9.6 oz (95.074 kg)  SpO2 98%       Objective:   Physical Exam  Constitutional: She is oriented to person, place, and time. She appears well-developed and well-nourished. No distress.  Neurological: She is alert and oriented to person, place, and time.  Psychiatric: Judgment and thought content normal.  Tearful throughout interview          Assessment & Plan:  15 minutes spent with pt today.  >50% of this time was spent counseling pt on anxiety/depression.

## 2014-12-23 NOTE — Assessment & Plan Note (Addendum)
Uncontrolled. Will rx with lexapro 10 mg.  I instructed pt to start 1/2 tablet once daily for 1 week and then increase to a full tablet once daily on week two as tolerated.   Plan follow up in 1 month to evaluate progress.  Refill provided for ativan and tramadol (she has not taken either recently) and a controlled substance contract is signed. Obtain UDS. Will also give her info on psychiatry and therapy referrals.

## 2014-12-25 ENCOUNTER — Other Ambulatory Visit: Payer: Self-pay | Admitting: Family

## 2014-12-25 NOTE — Telephone Encounter (Signed)
Cholesterol has risen significantly over the last 1 year.  I would recommend that she start a statin and work on low fat diet, exercise, weight loss. Also, triglycerides are high.  Continue krill oil. Please work on avoiding concentrated sweets, and limiting white carbs (rice/bread/pasta/potatoes). Instead substitute whole grain versions with reasonable portions.  Repeat flp in 3 months.

## 2014-12-26 ENCOUNTER — Telehealth: Payer: Self-pay | Admitting: Family Medicine

## 2014-12-26 NOTE — Telephone Encounter (Signed)
emmi emailed °

## 2014-12-27 NOTE — Telephone Encounter (Signed)
Left message on voicemail to return my call.  

## 2014-12-28 NOTE — Telephone Encounter (Signed)
Left message for pt to return my call.

## 2015-01-02 NOTE — Telephone Encounter (Signed)
Left message for pt to return my call and mailed letter to pt.

## 2015-01-20 ENCOUNTER — Encounter: Payer: Self-pay | Admitting: Family

## 2015-01-20 ENCOUNTER — Ambulatory Visit (INDEPENDENT_AMBULATORY_CARE_PROVIDER_SITE_OTHER): Payer: 59 | Admitting: Family

## 2015-01-20 VITALS — BP 120/90 | HR 68 | Temp 97.6°F | Resp 16 | Ht 63.75 in | Wt 207.0 lb

## 2015-01-20 DIAGNOSIS — F32A Depression, unspecified: Secondary | ICD-10-CM

## 2015-01-20 DIAGNOSIS — F329 Major depressive disorder, single episode, unspecified: Secondary | ICD-10-CM

## 2015-01-20 MED ORDER — ESCITALOPRAM OXALATE 20 MG PO TABS: 20.0000 mg | ORAL_TABLET | Freq: Every day | ORAL | Status: DC

## 2015-01-20 NOTE — Progress Notes (Signed)
Subjective:    Patient ID: Yvonne Rice, female    DOB: 08/25/1961, 54 y.o.   MRN: [id number]  HPI   Depression/anxiety- last visit she was started on lexapro 10mg . Today she reports some improvement- but still symptoms could be better. Still has a lot of stress. Using ativan bid.  Reports no panic attacks. She has not yet contacted number for therapist.  Tolerating without side effects.  Denies SI/HI   Review of Systems    see HPI  Past Medical History  Diagnosis Date  . Hypertension   . Depression   . Arthritis   . Left knee pain 01/23/2013  . Preventative health care 07/21/2013    History   Social History  . Marital Status: Divorced    Spouse Name: N/A  . Number of Children: N/A  . Years of Education: N/A   Occupational History  . Not on file.   Social History Main Topics  . Smoking status: Current Every Day Smoker -- 1.00 packs/day    Types: Cigarettes  . Smokeless tobacco: Never Used     Comment: No Nicotine Electric Cigarettes  . Alcohol Use: 0.0 oz/week    0 Standard drinks or equivalent per week     Comment: occasional drink  . Drug Use: No  . Sexual Activity: Not on file   Other Topics Concern  . Not on file   Social History Narrative    Past Surgical History  Procedure Laterality Date  . Knee surgery      Family History  Problem Relation Age of Onset  . Cancer Mother     colon  . Deep vein thrombosis Mother     previous smoker  . Peripheral vascular disease Mother   . Heart disease Father     MI  . Cancer Paternal Grandmother     ovarian, uterine, breast  . Mental illness Brother     PTSD  . Cancer Paternal Aunt     breast  . Polycystic ovary syndrome Daughter     Allergies  Allergen Reactions  . Sulfa Drugs Cross Reactors Hives    Current Outpatient Prescriptions on File Prior to Visit  Medication Sig Dispense Refill  . escitalopram (LEXAPRO) 10 MG tablet 1/2 tab by mouth once daily for 1 week, then increase to a full tab  once daily on week two 30 tablet 0  . Krill Oil CAPS MegaRed caps daily 60 capsule 11  . LORazepam (ATIVAN) 0.5 MG tablet Take 1 tablet (0.5 mg total) by mouth 2 (two) times daily as needed for anxiety. 40 tablet 1  . sulindac (CLINORIL) 200 MG tablet Take 1 tablet (200 mg total) by mouth 2 (two) times daily. 180 tablet 1  . traMADol (ULTRAM) 50 MG tablet Take 1 tablet (50 mg total) by mouth every 6 (six) hours as needed. 120 tablet 1  . vitamin C (ASCORBIC ACID) 500 MG tablet Take 1 tablet (500 mg total) by mouth daily. 30 tablet 5   No current facility-administered medications on file prior to visit.    BP 120/90 mmHg  Pulse 68  Temp(Src) 97.6 F (36.4 C) (Oral)  Resp 16  Ht 5' 3.75" (1.619 m)  Wt 207 lb (93.895 kg)  BMI 35.82 kg/m2  SpO2 99%    Objective:   Physical Exam  Constitutional: She is oriented to person, place, and time. She appears well-developed and well-nourished. No distress.  Neurological: She is alert and oriented to person, place, and time.  Psychiatric: She has a normal mood and affect. Her behavior is normal. Judgment and thought content normal.          Assessment & Plan:

## 2015-01-20 NOTE — Progress Notes (Signed)
Pre visit review using our clinic review tool, if applicable. No additional management support is needed unless otherwise documented below in the visit note. 

## 2015-01-20 NOTE — Patient Instructions (Signed)
Increase lexapro from 10mg  to 20mg  once daily. Follow up in 6 weeks.

## 2015-01-22 NOTE — Assessment & Plan Note (Signed)
Some improvement, but could be better. Increase lexapro from 10mg  to 20mg .  Follow up in 1 month.

## 2015-03-03 ENCOUNTER — Ambulatory Visit (INDEPENDENT_AMBULATORY_CARE_PROVIDER_SITE_OTHER): Payer: 59 | Admitting: Family

## 2015-03-03 VITALS — BP 130/80 | HR 77 | Temp 98.3°F | Resp 16 | Ht 63.75 in | Wt 203.8 lb

## 2015-03-03 DIAGNOSIS — F4323 Adjustment disorder with mixed anxiety and depressed mood: Secondary | ICD-10-CM

## 2015-03-03 DIAGNOSIS — Z720 Tobacco use: Secondary | ICD-10-CM | POA: Diagnosis not present

## 2015-03-03 DIAGNOSIS — F17200 Nicotine dependence, unspecified, uncomplicated: Secondary | ICD-10-CM

## 2015-03-03 MED ORDER — ESCITALOPRAM OXALATE 20 MG PO TABS: 20.0000 mg | ORAL_TABLET | Freq: Every day | ORAL | Status: AC

## 2015-03-03 NOTE — Patient Instructions (Addendum)
Start nicotine patch when you are ready. Dosing schedule is as follows:  21mg  patch once daily for six weeks, followed by 14mg /day  Patch once daily for two weeks, and finish with 7mg /day patch for two weeks. Follow up in 3 months.

## 2015-03-03 NOTE — Progress Notes (Signed)
Pre visit review using our clinic review tool, if applicable. No additional management support is needed unless otherwise documented below in the visit note. 

## 2015-03-03 NOTE — Assessment & Plan Note (Signed)
Pt was counseled on smoking cessation today and various methods to assist with smoking cessation for 5 minutes.  She wishes to proceed with nicotine patch OTC.

## 2015-03-03 NOTE — Assessment & Plan Note (Signed)
Improved on lexapro, continue same.  

## 2015-03-03 NOTE — Progress Notes (Signed)
Subjective:    Patient ID: Yvonne Rice, female    DOB: 05/19/1961, 54 y.o.   MRN: [id number]  HPI   Ms. Yvonne Rice is a 54 yr old female who presents today for follow up.  1) anxiety/depression- maintained on lexapro and ativan.  She was started on lexapro back in February and followed up in March.  She noted some improvement but felt that her symptoms were not optimally controlled. Lexapro was increased form 10mg  to 20mg . Reports sxs are improved.  Rarely needing ativan.  2) Tobacco abuse- wishes to discuss smoking cessation. Reports that she smokes 1/2 to 1 PPD.      Review of Systems See HPI  Past Medical History  Diagnosis Date  . Hypertension   . Depression   . Arthritis   . Left knee pain 01/23/2013  . Preventative health care 07/21/2013    History   Social History  . Marital Status: Divorced    Spouse Name: N/A  . Number of Children: N/A  . Years of Education: N/A   Occupational History  . Not on file.   Social History Main Topics  . Smoking status: Current Every Day Smoker -- 1.00 packs/day    Types: Cigarettes  . Smokeless tobacco: Never Used     Comment: No Nicotine Electric Cigarettes  . Alcohol Use: 0.0 oz/week    0 Standard drinks or equivalent per week     Comment: occasional drink  . Drug Use: No  . Sexual Activity: Not on file   Other Topics Concern  . Not on file   Social History Narrative    Past Surgical History  Procedure Laterality Date  . Knee surgery      Family History  Problem Relation Age of Onset  . Cancer Mother     colon  . Deep vein thrombosis Mother     previous smoker  . Peripheral vascular disease Mother   . Heart disease Father     MI  . Cancer Paternal Grandmother     ovarian, uterine, breast  . Mental illness Brother     PTSD  . Cancer Paternal Aunt     breast  . Polycystic ovary syndrome Daughter     Allergies  Allergen Reactions  . Sulfa Drugs Cross Reactors Hives    Current Outpatient  Prescriptions on File Prior to Visit  Medication Sig Dispense Refill  . LORazepam (ATIVAN) 0.5 MG tablet Take 1 tablet (0.5 mg total) by mouth 2 (two) times daily as needed for anxiety. 40 tablet 1  . sulindac (CLINORIL) 200 MG tablet Take 1 tablet (200 mg total) by mouth 2 (two) times daily. 180 tablet 1  . traMADol (ULTRAM) 50 MG tablet Take 1 tablet (50 mg total) by mouth every 6 (six) hours as needed. 120 tablet 1  . vitamin C (ASCORBIC ACID) 500 MG tablet Take 1 tablet (500 mg total) by mouth daily. 30 tablet 5   No current facility-administered medications on file prior to visit.    BP 130/80 mmHg  Pulse 77  Temp(Src) 98.3 F (36.8 C) (Oral)  Resp 16  Ht 5' 3.75" (1.619 m)  Wt 203 lb 12.8 oz (92.443 kg)  BMI 35.27 kg/m2  SpO2 98%       Objective:   Physical Exam  Constitutional: She is oriented to person, place, and time. She appears well-developed and well-nourished.  Pulmonary/Chest: Effort normal and breath sounds normal.  Neurological: She is alert and oriented to person, place, and  time.  Psychiatric: She has a normal mood and affect. Her behavior is normal. Judgment and thought content normal.          Assessment & Plan:

## 2015-06-09 ENCOUNTER — Ambulatory Visit: Payer: 59 | Admitting: Family Medicine

## 2015-07-05 ENCOUNTER — Telehealth: Payer: Self-pay | Admitting: Family Medicine

## 2015-07-05 DIAGNOSIS — M1712 Unilateral primary osteoarthritis, left knee: Secondary | ICD-10-CM

## 2015-07-05 NOTE — Telephone Encounter (Signed)
Caller name: Morrie Sheldon Relationship to patient: Pharmacist  Can be reached: [phone] (pt) [phone] (pharmacy)  Pharmacy:  Reason for call: Pharmacist called in to request a refill for pt.   traMADol     Since pt's last visit she has moved to [facility]. Pt need the Rx to [facility] to [facility] at [address] [place] [address].

## 2015-07-06 MED ORDER — TRAMADOL HCL 50 MG PO TABS: 50.0000 mg | ORAL_TABLET | Freq: Four times a day (QID) | ORAL | Status: AC | PRN

## 2015-07-06 NOTE — Addendum Note (Signed)
Addended by: Scharlene Gloss B on: 07/06/2015 01:06 PM   Modules accepted: Orders

## 2015-07-06 NOTE — Telephone Encounter (Signed)
Called the patient unable to speak to the patient to inform tramadol [facility]ed to [facility] in [place], [place] and to schedule with PCP in [place]. Other numbers in chart are not working.

## 2015-07-06 NOTE — Telephone Encounter (Signed)
Faxed hardcopy for Tramadol to [facility] at [phone]

## 2015-07-06 NOTE — Telephone Encounter (Signed)
She can have a 30 day prescription with 1 rf. But she needs to establish with a PMD there for more.

## 2015-07-06 NOTE — Telephone Encounter (Signed)
Printed and on counter for signature. 

## 2015-07-06 NOTE — Telephone Encounter (Signed)
Called the patient to inform but number does not take incoming phone calls.

## 2015-12-12 ENCOUNTER — Telehealth: Payer: Self-pay | Admitting: Behavioral Health

## 2015-12-12 NOTE — Telephone Encounter (Signed)
The following care gaps were assessed: Mammogram Pap (Hx of hysterectomy) Colon Flu  Attempted to reach patient at the time of call to discuss above gaps. Left message for patient to return call when available.
